# Patient Record
Sex: Female | Born: 1950 | Hispanic: Refuse to answer | Smoking: Never smoker
Health system: Southern US, Community
[De-identification: ages and names within clinical notes are randomized; demographics above are authoritative.]

## PROBLEM LIST (undated history)

## (undated) DIAGNOSIS — I10 Essential (primary) hypertension: Secondary | ICD-10-CM

## (undated) DIAGNOSIS — J449 Chronic obstructive pulmonary disease, unspecified: Secondary | ICD-10-CM

## (undated) DIAGNOSIS — J45909 Unspecified asthma, uncomplicated: Secondary | ICD-10-CM

## (undated) DIAGNOSIS — M109 Gout, unspecified: Secondary | ICD-10-CM

## (undated) DIAGNOSIS — I509 Heart failure, unspecified: Secondary | ICD-10-CM

## (undated) HISTORY — PX: COLON SURGERY: SHX602

## (undated) HISTORY — PX: LIPOMA EXCISION: SHX5283

## (undated) HISTORY — PX: PARTIAL HYSTERECTOMY: SHX80

## (undated) HISTORY — PX: CARPAL TUNNEL RELEASE: SHX101

## (undated) HISTORY — PX: ABDOMINAL HYSTERECTOMY: SHX81

---

## 2009-01-10 ENCOUNTER — Ambulatory Visit (HOSPITAL_COMMUNITY): Admission: RE | Admit: 2009-01-10 | Discharge: 2009-01-10 | Payer: Self-pay | Admitting: Ophthalmology

## 2009-01-30 ENCOUNTER — Ambulatory Visit (HOSPITAL_COMMUNITY): Admission: RE | Admit: 2009-01-30 | Discharge: 2009-01-30 | Payer: Self-pay | Admitting: Ophthalmology

## 2009-11-29 ENCOUNTER — Ambulatory Visit: Payer: Self-pay | Admitting: Cardiology

## 2010-10-19 LAB — POCT I-STAT 4, (NA,K, GLUC, HGB,HCT)
Glucose, Bld: 108 mg/dL — ABNORMAL HIGH (ref 70–99)
HCT: 41 % (ref 36.0–46.0)
Hemoglobin: 13.3 g/dL (ref 12.0–15.0)
Potassium: 4.3 mEq/L (ref 3.5–5.1)

## 2010-10-20 LAB — HEMOGLOBIN AND HEMATOCRIT, BLOOD
HCT: 35.2 % — ABNORMAL LOW (ref 36.0–46.0)
Hemoglobin: 12.2 g/dL (ref 12.0–15.0)

## 2010-10-20 LAB — BASIC METABOLIC PANEL
Chloride: 103 mEq/L (ref 96–112)
GFR calc Af Amer: 41 mL/min — ABNORMAL LOW (ref >60)
Potassium: 3.1 mEq/L — ABNORMAL LOW (ref 3.5–5.1)
Sodium: 139 mEq/L (ref 135–145)

## 2011-08-13 DIAGNOSIS — J0100 Acute maxillary sinusitis, unspecified: Secondary | ICD-10-CM | POA: Diagnosis not present

## 2011-08-13 DIAGNOSIS — N3941 Urge incontinence: Secondary | ICD-10-CM | POA: Diagnosis not present

## 2011-08-13 DIAGNOSIS — J45909 Unspecified asthma, uncomplicated: Secondary | ICD-10-CM | POA: Diagnosis not present

## 2011-08-13 DIAGNOSIS — M109 Gout, unspecified: Secondary | ICD-10-CM | POA: Diagnosis not present

## 2011-08-13 DIAGNOSIS — J209 Acute bronchitis, unspecified: Secondary | ICD-10-CM | POA: Diagnosis not present

## 2011-08-13 DIAGNOSIS — I10 Essential (primary) hypertension: Secondary | ICD-10-CM | POA: Diagnosis not present

## 2011-08-13 DIAGNOSIS — M549 Dorsalgia, unspecified: Secondary | ICD-10-CM | POA: Diagnosis not present

## 2011-11-20 DIAGNOSIS — J209 Acute bronchitis, unspecified: Secondary | ICD-10-CM | POA: Diagnosis not present

## 2011-11-20 DIAGNOSIS — M76899 Other specified enthesopathies of unspecified lower limb, excluding foot: Secondary | ICD-10-CM | POA: Diagnosis not present

## 2011-11-26 DIAGNOSIS — M1710 Unilateral primary osteoarthritis, unspecified knee: Secondary | ICD-10-CM | POA: Diagnosis not present

## 2011-12-22 DIAGNOSIS — J45909 Unspecified asthma, uncomplicated: Secondary | ICD-10-CM | POA: Diagnosis not present

## 2011-12-22 DIAGNOSIS — I10 Essential (primary) hypertension: Secondary | ICD-10-CM | POA: Diagnosis not present

## 2011-12-29 DIAGNOSIS — I10 Essential (primary) hypertension: Secondary | ICD-10-CM | POA: Diagnosis not present

## 2012-03-31 DIAGNOSIS — J209 Acute bronchitis, unspecified: Secondary | ICD-10-CM | POA: Diagnosis not present

## 2012-06-21 DIAGNOSIS — J449 Chronic obstructive pulmonary disease, unspecified: Secondary | ICD-10-CM | POA: Diagnosis not present

## 2012-06-28 DIAGNOSIS — R4182 Altered mental status, unspecified: Secondary | ICD-10-CM | POA: Diagnosis not present

## 2012-06-28 DIAGNOSIS — I499 Cardiac arrhythmia, unspecified: Secondary | ICD-10-CM | POA: Diagnosis not present

## 2012-06-28 DIAGNOSIS — I679 Cerebrovascular disease, unspecified: Secondary | ICD-10-CM | POA: Diagnosis not present

## 2012-06-28 DIAGNOSIS — R443 Hallucinations, unspecified: Secondary | ICD-10-CM | POA: Diagnosis not present

## 2012-06-28 DIAGNOSIS — R918 Other nonspecific abnormal finding of lung field: Secondary | ICD-10-CM | POA: Diagnosis not present

## 2012-06-28 DIAGNOSIS — R000 Tachycardia, unspecified: Secondary | ICD-10-CM | POA: Diagnosis not present

## 2012-07-08 DIAGNOSIS — Z23 Encounter for immunization: Secondary | ICD-10-CM | POA: Diagnosis not present

## 2012-07-09 DIAGNOSIS — J45901 Unspecified asthma with (acute) exacerbation: Secondary | ICD-10-CM | POA: Diagnosis not present

## 2012-07-09 DIAGNOSIS — I4891 Unspecified atrial fibrillation: Secondary | ICD-10-CM | POA: Diagnosis not present

## 2012-07-09 DIAGNOSIS — M159 Polyosteoarthritis, unspecified: Secondary | ICD-10-CM | POA: Diagnosis not present

## 2012-07-09 DIAGNOSIS — M109 Gout, unspecified: Secondary | ICD-10-CM | POA: Diagnosis not present

## 2012-07-09 DIAGNOSIS — I10 Essential (primary) hypertension: Secondary | ICD-10-CM | POA: Diagnosis not present

## 2012-07-11 DIAGNOSIS — I10 Essential (primary) hypertension: Secondary | ICD-10-CM | POA: Diagnosis not present

## 2012-07-11 DIAGNOSIS — I4891 Unspecified atrial fibrillation: Secondary | ICD-10-CM | POA: Diagnosis not present

## 2012-07-11 DIAGNOSIS — J45901 Unspecified asthma with (acute) exacerbation: Secondary | ICD-10-CM | POA: Diagnosis not present

## 2012-07-11 DIAGNOSIS — M109 Gout, unspecified: Secondary | ICD-10-CM | POA: Diagnosis not present

## 2012-07-11 DIAGNOSIS — M159 Polyosteoarthritis, unspecified: Secondary | ICD-10-CM | POA: Diagnosis not present

## 2012-07-12 DIAGNOSIS — I10 Essential (primary) hypertension: Secondary | ICD-10-CM | POA: Diagnosis not present

## 2012-07-12 DIAGNOSIS — I4891 Unspecified atrial fibrillation: Secondary | ICD-10-CM | POA: Diagnosis not present

## 2012-07-12 DIAGNOSIS — J45901 Unspecified asthma with (acute) exacerbation: Secondary | ICD-10-CM | POA: Diagnosis not present

## 2012-07-12 DIAGNOSIS — M109 Gout, unspecified: Secondary | ICD-10-CM | POA: Diagnosis not present

## 2012-07-12 DIAGNOSIS — M159 Polyosteoarthritis, unspecified: Secondary | ICD-10-CM | POA: Diagnosis not present

## 2012-07-14 DIAGNOSIS — M159 Polyosteoarthritis, unspecified: Secondary | ICD-10-CM | POA: Diagnosis not present

## 2012-07-14 DIAGNOSIS — M109 Gout, unspecified: Secondary | ICD-10-CM | POA: Diagnosis not present

## 2012-07-14 DIAGNOSIS — I4891 Unspecified atrial fibrillation: Secondary | ICD-10-CM | POA: Diagnosis not present

## 2012-07-14 DIAGNOSIS — J45901 Unspecified asthma with (acute) exacerbation: Secondary | ICD-10-CM | POA: Diagnosis not present

## 2012-07-14 DIAGNOSIS — I10 Essential (primary) hypertension: Secondary | ICD-10-CM | POA: Diagnosis not present

## 2012-07-15 DIAGNOSIS — J45901 Unspecified asthma with (acute) exacerbation: Secondary | ICD-10-CM | POA: Diagnosis not present

## 2012-07-15 DIAGNOSIS — M109 Gout, unspecified: Secondary | ICD-10-CM | POA: Diagnosis not present

## 2012-07-15 DIAGNOSIS — I10 Essential (primary) hypertension: Secondary | ICD-10-CM | POA: Diagnosis not present

## 2012-07-15 DIAGNOSIS — I4891 Unspecified atrial fibrillation: Secondary | ICD-10-CM | POA: Diagnosis not present

## 2012-07-15 DIAGNOSIS — M159 Polyosteoarthritis, unspecified: Secondary | ICD-10-CM | POA: Diagnosis not present

## 2012-07-18 DIAGNOSIS — F05 Delirium due to known physiological condition: Secondary | ICD-10-CM | POA: Diagnosis not present

## 2012-07-18 DIAGNOSIS — M159 Polyosteoarthritis, unspecified: Secondary | ICD-10-CM | POA: Diagnosis not present

## 2012-07-18 DIAGNOSIS — I10 Essential (primary) hypertension: Secondary | ICD-10-CM | POA: Diagnosis not present

## 2012-07-18 DIAGNOSIS — M109 Gout, unspecified: Secondary | ICD-10-CM | POA: Diagnosis not present

## 2012-07-18 DIAGNOSIS — J45901 Unspecified asthma with (acute) exacerbation: Secondary | ICD-10-CM | POA: Diagnosis not present

## 2012-07-18 DIAGNOSIS — I4891 Unspecified atrial fibrillation: Secondary | ICD-10-CM | POA: Diagnosis not present

## 2012-07-19 DIAGNOSIS — I10 Essential (primary) hypertension: Secondary | ICD-10-CM | POA: Diagnosis not present

## 2012-07-19 DIAGNOSIS — I4891 Unspecified atrial fibrillation: Secondary | ICD-10-CM | POA: Diagnosis not present

## 2012-07-19 DIAGNOSIS — J45901 Unspecified asthma with (acute) exacerbation: Secondary | ICD-10-CM | POA: Diagnosis not present

## 2012-07-19 DIAGNOSIS — M159 Polyosteoarthritis, unspecified: Secondary | ICD-10-CM | POA: Diagnosis not present

## 2012-07-19 DIAGNOSIS — M109 Gout, unspecified: Secondary | ICD-10-CM | POA: Diagnosis not present

## 2012-07-20 DIAGNOSIS — M159 Polyosteoarthritis, unspecified: Secondary | ICD-10-CM | POA: Diagnosis not present

## 2012-07-20 DIAGNOSIS — M109 Gout, unspecified: Secondary | ICD-10-CM | POA: Diagnosis not present

## 2012-07-20 DIAGNOSIS — J45901 Unspecified asthma with (acute) exacerbation: Secondary | ICD-10-CM | POA: Diagnosis not present

## 2012-07-20 DIAGNOSIS — I4891 Unspecified atrial fibrillation: Secondary | ICD-10-CM | POA: Diagnosis not present

## 2012-07-20 DIAGNOSIS — I10 Essential (primary) hypertension: Secondary | ICD-10-CM | POA: Diagnosis not present

## 2012-07-21 DIAGNOSIS — J45901 Unspecified asthma with (acute) exacerbation: Secondary | ICD-10-CM | POA: Diagnosis not present

## 2012-07-21 DIAGNOSIS — I10 Essential (primary) hypertension: Secondary | ICD-10-CM | POA: Diagnosis not present

## 2012-07-21 DIAGNOSIS — M159 Polyosteoarthritis, unspecified: Secondary | ICD-10-CM | POA: Diagnosis not present

## 2012-07-21 DIAGNOSIS — M109 Gout, unspecified: Secondary | ICD-10-CM | POA: Diagnosis not present

## 2012-07-21 DIAGNOSIS — I4891 Unspecified atrial fibrillation: Secondary | ICD-10-CM | POA: Diagnosis not present

## 2012-07-25 DIAGNOSIS — M159 Polyosteoarthritis, unspecified: Secondary | ICD-10-CM | POA: Diagnosis not present

## 2012-07-25 DIAGNOSIS — I4891 Unspecified atrial fibrillation: Secondary | ICD-10-CM | POA: Diagnosis not present

## 2012-07-25 DIAGNOSIS — J45901 Unspecified asthma with (acute) exacerbation: Secondary | ICD-10-CM | POA: Diagnosis not present

## 2012-07-25 DIAGNOSIS — I10 Essential (primary) hypertension: Secondary | ICD-10-CM | POA: Diagnosis not present

## 2012-07-25 DIAGNOSIS — M109 Gout, unspecified: Secondary | ICD-10-CM | POA: Diagnosis not present

## 2012-07-27 DIAGNOSIS — I10 Essential (primary) hypertension: Secondary | ICD-10-CM | POA: Diagnosis not present

## 2012-07-27 DIAGNOSIS — M159 Polyosteoarthritis, unspecified: Secondary | ICD-10-CM | POA: Diagnosis not present

## 2012-07-27 DIAGNOSIS — M109 Gout, unspecified: Secondary | ICD-10-CM | POA: Diagnosis not present

## 2012-07-27 DIAGNOSIS — I4891 Unspecified atrial fibrillation: Secondary | ICD-10-CM | POA: Diagnosis not present

## 2012-07-27 DIAGNOSIS — J45901 Unspecified asthma with (acute) exacerbation: Secondary | ICD-10-CM | POA: Diagnosis not present

## 2012-08-02 DIAGNOSIS — M159 Polyosteoarthritis, unspecified: Secondary | ICD-10-CM | POA: Diagnosis not present

## 2012-08-02 DIAGNOSIS — M109 Gout, unspecified: Secondary | ICD-10-CM | POA: Diagnosis not present

## 2012-08-02 DIAGNOSIS — I10 Essential (primary) hypertension: Secondary | ICD-10-CM | POA: Diagnosis not present

## 2012-08-02 DIAGNOSIS — I4891 Unspecified atrial fibrillation: Secondary | ICD-10-CM | POA: Diagnosis not present

## 2012-08-02 DIAGNOSIS — J45901 Unspecified asthma with (acute) exacerbation: Secondary | ICD-10-CM | POA: Diagnosis not present

## 2012-08-03 DIAGNOSIS — I10 Essential (primary) hypertension: Secondary | ICD-10-CM | POA: Diagnosis not present

## 2012-08-03 DIAGNOSIS — I4891 Unspecified atrial fibrillation: Secondary | ICD-10-CM | POA: Diagnosis not present

## 2012-08-03 DIAGNOSIS — M109 Gout, unspecified: Secondary | ICD-10-CM | POA: Diagnosis not present

## 2012-08-03 DIAGNOSIS — J45901 Unspecified asthma with (acute) exacerbation: Secondary | ICD-10-CM | POA: Diagnosis not present

## 2012-08-03 DIAGNOSIS — M159 Polyosteoarthritis, unspecified: Secondary | ICD-10-CM | POA: Diagnosis not present

## 2012-08-04 DIAGNOSIS — I10 Essential (primary) hypertension: Secondary | ICD-10-CM | POA: Diagnosis not present

## 2012-08-04 DIAGNOSIS — M159 Polyosteoarthritis, unspecified: Secondary | ICD-10-CM | POA: Diagnosis not present

## 2012-08-04 DIAGNOSIS — I4891 Unspecified atrial fibrillation: Secondary | ICD-10-CM | POA: Diagnosis not present

## 2012-08-04 DIAGNOSIS — M109 Gout, unspecified: Secondary | ICD-10-CM | POA: Diagnosis not present

## 2012-08-04 DIAGNOSIS — J45901 Unspecified asthma with (acute) exacerbation: Secondary | ICD-10-CM | POA: Diagnosis not present

## 2012-08-05 DIAGNOSIS — J45901 Unspecified asthma with (acute) exacerbation: Secondary | ICD-10-CM | POA: Diagnosis not present

## 2012-08-05 DIAGNOSIS — M109 Gout, unspecified: Secondary | ICD-10-CM | POA: Diagnosis not present

## 2012-08-05 DIAGNOSIS — I10 Essential (primary) hypertension: Secondary | ICD-10-CM | POA: Diagnosis not present

## 2012-08-05 DIAGNOSIS — M159 Polyosteoarthritis, unspecified: Secondary | ICD-10-CM | POA: Diagnosis not present

## 2012-08-05 DIAGNOSIS — I4891 Unspecified atrial fibrillation: Secondary | ICD-10-CM | POA: Diagnosis not present

## 2012-08-09 DIAGNOSIS — M109 Gout, unspecified: Secondary | ICD-10-CM | POA: Diagnosis not present

## 2012-08-09 DIAGNOSIS — M159 Polyosteoarthritis, unspecified: Secondary | ICD-10-CM | POA: Diagnosis not present

## 2012-08-09 DIAGNOSIS — J45901 Unspecified asthma with (acute) exacerbation: Secondary | ICD-10-CM | POA: Diagnosis not present

## 2012-08-09 DIAGNOSIS — I10 Essential (primary) hypertension: Secondary | ICD-10-CM | POA: Diagnosis not present

## 2012-08-09 DIAGNOSIS — I4891 Unspecified atrial fibrillation: Secondary | ICD-10-CM | POA: Diagnosis not present

## 2012-08-11 DIAGNOSIS — J45901 Unspecified asthma with (acute) exacerbation: Secondary | ICD-10-CM | POA: Diagnosis not present

## 2012-08-11 DIAGNOSIS — I4891 Unspecified atrial fibrillation: Secondary | ICD-10-CM | POA: Diagnosis not present

## 2012-08-11 DIAGNOSIS — M159 Polyosteoarthritis, unspecified: Secondary | ICD-10-CM | POA: Diagnosis not present

## 2012-08-11 DIAGNOSIS — M109 Gout, unspecified: Secondary | ICD-10-CM | POA: Diagnosis not present

## 2012-08-11 DIAGNOSIS — I10 Essential (primary) hypertension: Secondary | ICD-10-CM | POA: Diagnosis not present

## 2012-08-17 DIAGNOSIS — M109 Gout, unspecified: Secondary | ICD-10-CM | POA: Diagnosis not present

## 2012-08-17 DIAGNOSIS — I10 Essential (primary) hypertension: Secondary | ICD-10-CM | POA: Diagnosis not present

## 2012-08-17 DIAGNOSIS — I4891 Unspecified atrial fibrillation: Secondary | ICD-10-CM | POA: Diagnosis not present

## 2012-08-17 DIAGNOSIS — M159 Polyosteoarthritis, unspecified: Secondary | ICD-10-CM | POA: Diagnosis not present

## 2012-08-17 DIAGNOSIS — J45901 Unspecified asthma with (acute) exacerbation: Secondary | ICD-10-CM | POA: Diagnosis not present

## 2012-08-23 DIAGNOSIS — I10 Essential (primary) hypertension: Secondary | ICD-10-CM | POA: Diagnosis not present

## 2012-08-23 DIAGNOSIS — J45901 Unspecified asthma with (acute) exacerbation: Secondary | ICD-10-CM | POA: Diagnosis not present

## 2012-08-23 DIAGNOSIS — M159 Polyosteoarthritis, unspecified: Secondary | ICD-10-CM | POA: Diagnosis not present

## 2012-08-23 DIAGNOSIS — M109 Gout, unspecified: Secondary | ICD-10-CM | POA: Diagnosis not present

## 2012-08-23 DIAGNOSIS — I4891 Unspecified atrial fibrillation: Secondary | ICD-10-CM | POA: Diagnosis not present

## 2012-08-27 DIAGNOSIS — Z79899 Other long term (current) drug therapy: Secondary | ICD-10-CM | POA: Diagnosis not present

## 2012-08-27 DIAGNOSIS — I10 Essential (primary) hypertension: Secondary | ICD-10-CM | POA: Diagnosis not present

## 2012-08-27 DIAGNOSIS — J0100 Acute maxillary sinusitis, unspecified: Secondary | ICD-10-CM | POA: Diagnosis not present

## 2012-08-27 DIAGNOSIS — IMO0002 Reserved for concepts with insufficient information to code with codable children: Secondary | ICD-10-CM | POA: Diagnosis not present

## 2012-08-27 DIAGNOSIS — J0110 Acute frontal sinusitis, unspecified: Secondary | ICD-10-CM | POA: Diagnosis not present

## 2012-08-27 DIAGNOSIS — J329 Chronic sinusitis, unspecified: Secondary | ICD-10-CM | POA: Diagnosis not present

## 2012-09-02 DIAGNOSIS — R0789 Other chest pain: Secondary | ICD-10-CM | POA: Diagnosis not present

## 2012-09-02 DIAGNOSIS — R52 Pain, unspecified: Secondary | ICD-10-CM | POA: Diagnosis not present

## 2012-09-06 DIAGNOSIS — I10 Essential (primary) hypertension: Secondary | ICD-10-CM | POA: Diagnosis not present

## 2012-10-19 DIAGNOSIS — H524 Presbyopia: Secondary | ICD-10-CM | POA: Diagnosis not present

## 2012-10-19 DIAGNOSIS — Z961 Presence of intraocular lens: Secondary | ICD-10-CM | POA: Diagnosis not present

## 2012-10-19 DIAGNOSIS — H35379 Puckering of macula, unspecified eye: Secondary | ICD-10-CM | POA: Diagnosis not present

## 2012-11-06 DIAGNOSIS — J45901 Unspecified asthma with (acute) exacerbation: Secondary | ICD-10-CM | POA: Diagnosis not present

## 2012-11-06 DIAGNOSIS — I4891 Unspecified atrial fibrillation: Secondary | ICD-10-CM | POA: Diagnosis not present

## 2012-11-06 DIAGNOSIS — R32 Unspecified urinary incontinence: Secondary | ICD-10-CM | POA: Diagnosis not present

## 2012-11-06 DIAGNOSIS — M159 Polyosteoarthritis, unspecified: Secondary | ICD-10-CM | POA: Diagnosis not present

## 2012-11-29 DIAGNOSIS — I10 Essential (primary) hypertension: Secondary | ICD-10-CM | POA: Diagnosis not present

## 2012-11-29 DIAGNOSIS — Z0000 Encounter for general adult medical examination without abnormal findings: Secondary | ICD-10-CM | POA: Diagnosis not present

## 2012-12-27 DIAGNOSIS — J45909 Unspecified asthma, uncomplicated: Secondary | ICD-10-CM | POA: Diagnosis not present

## 2012-12-27 DIAGNOSIS — Z01419 Encounter for gynecological examination (general) (routine) without abnormal findings: Secondary | ICD-10-CM | POA: Diagnosis not present

## 2012-12-27 DIAGNOSIS — I10 Essential (primary) hypertension: Secondary | ICD-10-CM | POA: Diagnosis not present

## 2012-12-29 DIAGNOSIS — M25569 Pain in unspecified knee: Secondary | ICD-10-CM | POA: Diagnosis not present

## 2012-12-29 DIAGNOSIS — M1710 Unilateral primary osteoarthritis, unspecified knee: Secondary | ICD-10-CM | POA: Diagnosis not present

## 2013-01-05 DIAGNOSIS — R32 Unspecified urinary incontinence: Secondary | ICD-10-CM | POA: Diagnosis not present

## 2013-01-05 DIAGNOSIS — J45901 Unspecified asthma with (acute) exacerbation: Secondary | ICD-10-CM | POA: Diagnosis not present

## 2013-01-05 DIAGNOSIS — I4891 Unspecified atrial fibrillation: Secondary | ICD-10-CM | POA: Diagnosis not present

## 2013-01-05 DIAGNOSIS — M159 Polyosteoarthritis, unspecified: Secondary | ICD-10-CM | POA: Diagnosis not present

## 2013-01-26 DIAGNOSIS — R161 Splenomegaly, not elsewhere classified: Secondary | ICD-10-CM | POA: Diagnosis not present

## 2013-02-02 DIAGNOSIS — R161 Splenomegaly, not elsewhere classified: Secondary | ICD-10-CM | POA: Diagnosis not present

## 2013-02-02 DIAGNOSIS — N281 Cyst of kidney, acquired: Secondary | ICD-10-CM | POA: Diagnosis not present

## 2013-02-02 DIAGNOSIS — Z1211 Encounter for screening for malignant neoplasm of colon: Secondary | ICD-10-CM | POA: Diagnosis not present

## 2013-02-06 DIAGNOSIS — M899 Disorder of bone, unspecified: Secondary | ICD-10-CM | POA: Diagnosis not present

## 2013-02-06 DIAGNOSIS — Z90710 Acquired absence of both cervix and uterus: Secondary | ICD-10-CM | POA: Diagnosis not present

## 2013-02-06 DIAGNOSIS — Z780 Asymptomatic menopausal state: Secondary | ICD-10-CM | POA: Diagnosis not present

## 2013-02-16 DIAGNOSIS — Z961 Presence of intraocular lens: Secondary | ICD-10-CM | POA: Diagnosis not present

## 2013-02-16 DIAGNOSIS — H353190 Nonexudative age-related macular degeneration, unspecified eye, stage unspecified: Secondary | ICD-10-CM | POA: Diagnosis not present

## 2013-02-21 DIAGNOSIS — I10 Essential (primary) hypertension: Secondary | ICD-10-CM | POA: Diagnosis not present

## 2013-02-21 DIAGNOSIS — Z8249 Family history of ischemic heart disease and other diseases of the circulatory system: Secondary | ICD-10-CM | POA: Diagnosis not present

## 2013-02-21 DIAGNOSIS — J45909 Unspecified asthma, uncomplicated: Secondary | ICD-10-CM | POA: Diagnosis not present

## 2013-02-21 DIAGNOSIS — E7800 Pure hypercholesterolemia, unspecified: Secondary | ICD-10-CM | POA: Diagnosis not present

## 2013-02-21 DIAGNOSIS — Z1211 Encounter for screening for malignant neoplasm of colon: Secondary | ICD-10-CM | POA: Diagnosis not present

## 2013-02-21 DIAGNOSIS — Z79899 Other long term (current) drug therapy: Secondary | ICD-10-CM | POA: Diagnosis not present

## 2013-03-06 DIAGNOSIS — R32 Unspecified urinary incontinence: Secondary | ICD-10-CM | POA: Diagnosis not present

## 2013-03-06 DIAGNOSIS — M159 Polyosteoarthritis, unspecified: Secondary | ICD-10-CM | POA: Diagnosis not present

## 2013-03-06 DIAGNOSIS — I4891 Unspecified atrial fibrillation: Secondary | ICD-10-CM | POA: Diagnosis not present

## 2013-03-06 DIAGNOSIS — J45901 Unspecified asthma with (acute) exacerbation: Secondary | ICD-10-CM | POA: Diagnosis not present

## 2013-03-21 DIAGNOSIS — J309 Allergic rhinitis, unspecified: Secondary | ICD-10-CM | POA: Diagnosis not present

## 2013-05-05 DIAGNOSIS — J45901 Unspecified asthma with (acute) exacerbation: Secondary | ICD-10-CM | POA: Diagnosis not present

## 2013-05-05 DIAGNOSIS — I4891 Unspecified atrial fibrillation: Secondary | ICD-10-CM | POA: Diagnosis not present

## 2013-05-05 DIAGNOSIS — M159 Polyosteoarthritis, unspecified: Secondary | ICD-10-CM | POA: Diagnosis not present

## 2013-05-05 DIAGNOSIS — R32 Unspecified urinary incontinence: Secondary | ICD-10-CM | POA: Diagnosis not present

## 2013-06-12 DIAGNOSIS — I10 Essential (primary) hypertension: Secondary | ICD-10-CM | POA: Diagnosis not present

## 2013-06-12 DIAGNOSIS — Z131 Encounter for screening for diabetes mellitus: Secondary | ICD-10-CM | POA: Diagnosis not present

## 2013-07-04 DIAGNOSIS — J45901 Unspecified asthma with (acute) exacerbation: Secondary | ICD-10-CM | POA: Diagnosis not present

## 2013-07-04 DIAGNOSIS — I4891 Unspecified atrial fibrillation: Secondary | ICD-10-CM | POA: Diagnosis not present

## 2013-07-04 DIAGNOSIS — M159 Polyosteoarthritis, unspecified: Secondary | ICD-10-CM | POA: Diagnosis not present

## 2013-07-04 DIAGNOSIS — R32 Unspecified urinary incontinence: Secondary | ICD-10-CM | POA: Diagnosis not present

## 2013-07-27 DIAGNOSIS — Z1211 Encounter for screening for malignant neoplasm of colon: Secondary | ICD-10-CM | POA: Diagnosis not present

## 2013-07-27 DIAGNOSIS — I10 Essential (primary) hypertension: Secondary | ICD-10-CM | POA: Diagnosis not present

## 2013-08-21 DIAGNOSIS — Z961 Presence of intraocular lens: Secondary | ICD-10-CM | POA: Diagnosis not present

## 2013-08-21 DIAGNOSIS — H35379 Puckering of macula, unspecified eye: Secondary | ICD-10-CM | POA: Diagnosis not present

## 2013-08-25 DIAGNOSIS — Z833 Family history of diabetes mellitus: Secondary | ICD-10-CM | POA: Diagnosis not present

## 2013-08-25 DIAGNOSIS — Z825 Family history of asthma and other chronic lower respiratory diseases: Secondary | ICD-10-CM | POA: Diagnosis not present

## 2013-08-25 DIAGNOSIS — Z79899 Other long term (current) drug therapy: Secondary | ICD-10-CM | POA: Diagnosis not present

## 2013-08-25 DIAGNOSIS — K449 Diaphragmatic hernia without obstruction or gangrene: Secondary | ICD-10-CM | POA: Diagnosis not present

## 2013-08-25 DIAGNOSIS — J441 Chronic obstructive pulmonary disease with (acute) exacerbation: Secondary | ICD-10-CM | POA: Diagnosis not present

## 2013-08-25 DIAGNOSIS — J45901 Unspecified asthma with (acute) exacerbation: Secondary | ICD-10-CM | POA: Diagnosis not present

## 2013-08-25 DIAGNOSIS — I10 Essential (primary) hypertension: Secondary | ICD-10-CM | POA: Diagnosis not present

## 2013-08-25 DIAGNOSIS — E785 Hyperlipidemia, unspecified: Secondary | ICD-10-CM | POA: Diagnosis not present

## 2013-08-25 DIAGNOSIS — Z780 Asymptomatic menopausal state: Secondary | ICD-10-CM | POA: Diagnosis not present

## 2013-08-25 DIAGNOSIS — J9819 Other pulmonary collapse: Secondary | ICD-10-CM | POA: Diagnosis not present

## 2013-08-25 DIAGNOSIS — Z8249 Family history of ischemic heart disease and other diseases of the circulatory system: Secondary | ICD-10-CM | POA: Diagnosis not present

## 2013-08-25 DIAGNOSIS — Z880 Allergy status to penicillin: Secondary | ICD-10-CM | POA: Diagnosis not present

## 2013-08-25 DIAGNOSIS — R0602 Shortness of breath: Secondary | ICD-10-CM | POA: Diagnosis not present

## 2013-08-25 DIAGNOSIS — IMO0002 Reserved for concepts with insufficient information to code with codable children: Secondary | ICD-10-CM | POA: Diagnosis not present

## 2013-08-25 DIAGNOSIS — M109 Gout, unspecified: Secondary | ICD-10-CM | POA: Diagnosis not present

## 2013-08-26 DIAGNOSIS — K449 Diaphragmatic hernia without obstruction or gangrene: Secondary | ICD-10-CM | POA: Diagnosis not present

## 2013-08-26 DIAGNOSIS — I10 Essential (primary) hypertension: Secondary | ICD-10-CM | POA: Diagnosis not present

## 2013-08-26 DIAGNOSIS — J45901 Unspecified asthma with (acute) exacerbation: Secondary | ICD-10-CM | POA: Diagnosis not present

## 2013-08-26 DIAGNOSIS — M109 Gout, unspecified: Secondary | ICD-10-CM | POA: Diagnosis not present

## 2013-08-26 DIAGNOSIS — R0602 Shortness of breath: Secondary | ICD-10-CM | POA: Diagnosis not present

## 2013-08-27 DIAGNOSIS — I10 Essential (primary) hypertension: Secondary | ICD-10-CM | POA: Diagnosis not present

## 2013-08-27 DIAGNOSIS — R0602 Shortness of breath: Secondary | ICD-10-CM | POA: Diagnosis not present

## 2013-08-27 DIAGNOSIS — K449 Diaphragmatic hernia without obstruction or gangrene: Secondary | ICD-10-CM | POA: Diagnosis not present

## 2013-08-27 DIAGNOSIS — M109 Gout, unspecified: Secondary | ICD-10-CM | POA: Diagnosis not present

## 2013-08-27 DIAGNOSIS — J45901 Unspecified asthma with (acute) exacerbation: Secondary | ICD-10-CM | POA: Diagnosis not present

## 2013-09-02 DIAGNOSIS — R32 Unspecified urinary incontinence: Secondary | ICD-10-CM | POA: Diagnosis not present

## 2013-09-02 DIAGNOSIS — M159 Polyosteoarthritis, unspecified: Secondary | ICD-10-CM | POA: Diagnosis not present

## 2013-09-02 DIAGNOSIS — J45901 Unspecified asthma with (acute) exacerbation: Secondary | ICD-10-CM | POA: Diagnosis not present

## 2013-09-02 DIAGNOSIS — I4891 Unspecified atrial fibrillation: Secondary | ICD-10-CM | POA: Diagnosis not present

## 2013-09-18 DIAGNOSIS — J309 Allergic rhinitis, unspecified: Secondary | ICD-10-CM | POA: Diagnosis not present

## 2013-09-18 DIAGNOSIS — S838X9A Sprain of other specified parts of unspecified knee, initial encounter: Secondary | ICD-10-CM | POA: Diagnosis not present

## 2013-11-01 DIAGNOSIS — J45901 Unspecified asthma with (acute) exacerbation: Secondary | ICD-10-CM | POA: Diagnosis not present

## 2013-11-01 DIAGNOSIS — R32 Unspecified urinary incontinence: Secondary | ICD-10-CM | POA: Diagnosis not present

## 2013-11-01 DIAGNOSIS — M159 Polyosteoarthritis, unspecified: Secondary | ICD-10-CM | POA: Diagnosis not present

## 2013-11-01 DIAGNOSIS — I4891 Unspecified atrial fibrillation: Secondary | ICD-10-CM | POA: Diagnosis not present

## 2013-11-02 DIAGNOSIS — I10 Essential (primary) hypertension: Secondary | ICD-10-CM | POA: Diagnosis not present

## 2013-11-02 DIAGNOSIS — J45909 Unspecified asthma, uncomplicated: Secondary | ICD-10-CM | POA: Diagnosis not present

## 2013-11-11 DIAGNOSIS — I10 Essential (primary) hypertension: Secondary | ICD-10-CM | POA: Diagnosis not present

## 2013-11-11 DIAGNOSIS — Z79899 Other long term (current) drug therapy: Secondary | ICD-10-CM | POA: Diagnosis not present

## 2013-11-11 DIAGNOSIS — L03119 Cellulitis of unspecified part of limb: Secondary | ICD-10-CM | POA: Diagnosis not present

## 2013-11-11 DIAGNOSIS — J45909 Unspecified asthma, uncomplicated: Secondary | ICD-10-CM | POA: Diagnosis not present

## 2013-11-11 DIAGNOSIS — L02619 Cutaneous abscess of unspecified foot: Secondary | ICD-10-CM | POA: Diagnosis not present

## 2013-11-11 DIAGNOSIS — IMO0002 Reserved for concepts with insufficient information to code with codable children: Secondary | ICD-10-CM | POA: Diagnosis not present

## 2013-11-13 DIAGNOSIS — L02419 Cutaneous abscess of limb, unspecified: Secondary | ICD-10-CM | POA: Diagnosis not present

## 2013-11-24 DIAGNOSIS — S81009A Unspecified open wound, unspecified knee, initial encounter: Secondary | ICD-10-CM | POA: Diagnosis not present

## 2013-11-24 DIAGNOSIS — J45909 Unspecified asthma, uncomplicated: Secondary | ICD-10-CM | POA: Diagnosis not present

## 2013-11-24 DIAGNOSIS — Z8249 Family history of ischemic heart disease and other diseases of the circulatory system: Secondary | ICD-10-CM | POA: Diagnosis not present

## 2013-11-24 DIAGNOSIS — I10 Essential (primary) hypertension: Secondary | ICD-10-CM | POA: Diagnosis not present

## 2013-11-24 DIAGNOSIS — S90569A Insect bite (nonvenomous), unspecified ankle, initial encounter: Secondary | ICD-10-CM | POA: Diagnosis not present

## 2013-11-24 DIAGNOSIS — L97509 Non-pressure chronic ulcer of other part of unspecified foot with unspecified severity: Secondary | ICD-10-CM | POA: Diagnosis not present

## 2013-11-24 DIAGNOSIS — L089 Local infection of the skin and subcutaneous tissue, unspecified: Secondary | ICD-10-CM | POA: Diagnosis not present

## 2013-11-24 DIAGNOSIS — K219 Gastro-esophageal reflux disease without esophagitis: Secondary | ICD-10-CM | POA: Diagnosis not present

## 2013-11-24 DIAGNOSIS — Z79899 Other long term (current) drug therapy: Secondary | ICD-10-CM | POA: Diagnosis not present

## 2013-11-24 DIAGNOSIS — T148 Other injury of unspecified body region: Secondary | ICD-10-CM | POA: Diagnosis not present

## 2013-11-24 DIAGNOSIS — I509 Heart failure, unspecified: Secondary | ICD-10-CM | POA: Diagnosis not present

## 2013-11-24 DIAGNOSIS — E7800 Pure hypercholesterolemia, unspecified: Secondary | ICD-10-CM | POA: Diagnosis not present

## 2013-11-24 DIAGNOSIS — IMO0002 Reserved for concepts with insufficient information to code with codable children: Secondary | ICD-10-CM | POA: Diagnosis not present

## 2013-11-24 DIAGNOSIS — W57XXXA Bitten or stung by nonvenomous insect and other nonvenomous arthropods, initial encounter: Secondary | ICD-10-CM | POA: Diagnosis not present

## 2013-11-25 ENCOUNTER — Emergency Department (HOSPITAL_COMMUNITY)
Admission: EM | Admit: 2013-11-25 | Discharge: 2013-11-25 | Disposition: A | Discharge status: Home / Self Care | Payer: Medicare Other | Attending: Emergency Medicine | Admitting: Emergency Medicine

## 2013-11-25 ENCOUNTER — Emergency Department (HOSPITAL_COMMUNITY): Payer: Medicare Other

## 2013-11-25 ENCOUNTER — Encounter (HOSPITAL_COMMUNITY): Payer: Self-pay | Admitting: Emergency Medicine

## 2013-11-25 DIAGNOSIS — I509 Heart failure, unspecified: Secondary | ICD-10-CM | POA: Insufficient documentation

## 2013-11-25 DIAGNOSIS — Z880 Allergy status to penicillin: Secondary | ICD-10-CM | POA: Diagnosis not present

## 2013-11-25 DIAGNOSIS — Z8639 Personal history of other endocrine, nutritional and metabolic disease: Secondary | ICD-10-CM | POA: Insufficient documentation

## 2013-11-25 DIAGNOSIS — J449 Chronic obstructive pulmonary disease, unspecified: Secondary | ICD-10-CM | POA: Insufficient documentation

## 2013-11-25 DIAGNOSIS — W57XXXA Bitten or stung by nonvenomous insect and other nonvenomous arthropods, initial encounter: Secondary | ICD-10-CM | POA: Diagnosis not present

## 2013-11-25 DIAGNOSIS — S81009A Unspecified open wound, unspecified knee, initial encounter: Secondary | ICD-10-CM | POA: Diagnosis not present

## 2013-11-25 DIAGNOSIS — L089 Local infection of the skin and subcutaneous tissue, unspecified: Secondary | ICD-10-CM | POA: Diagnosis not present

## 2013-11-25 DIAGNOSIS — I10 Essential (primary) hypertension: Secondary | ICD-10-CM | POA: Insufficient documentation

## 2013-11-25 DIAGNOSIS — J4489 Other specified chronic obstructive pulmonary disease: Secondary | ICD-10-CM | POA: Insufficient documentation

## 2013-11-25 DIAGNOSIS — L97509 Non-pressure chronic ulcer of other part of unspecified foot with unspecified severity: Secondary | ICD-10-CM | POA: Insufficient documentation

## 2013-11-25 DIAGNOSIS — Z862 Personal history of diseases of the blood and blood-forming organs and certain disorders involving the immune mechanism: Secondary | ICD-10-CM | POA: Insufficient documentation

## 2013-11-25 HISTORY — DX: Chronic obstructive pulmonary disease, unspecified: J44.9

## 2013-11-25 HISTORY — DX: Gout, unspecified: M10.9

## 2013-11-25 HISTORY — DX: Heart failure, unspecified: I50.9

## 2013-11-25 HISTORY — DX: Essential (primary) hypertension: I10

## 2013-11-25 HISTORY — DX: Unspecified asthma, uncomplicated: J45.909

## 2013-11-25 LAB — COMPREHENSIVE METABOLIC PANEL
ALT: 17 U/L (ref 0–35)
AST: 21 U/L (ref 0–37)
Albumin: 3.8 g/dL (ref 3.5–5.2)
Alkaline Phosphatase: 81 U/L (ref 39–117)
BUN: 16 mg/dL (ref 6–23)
CALCIUM: 10.1 mg/dL (ref 8.4–10.5)
CO2: 31 mEq/L (ref 19–32)
CREATININE: 1.54 mg/dL — AB (ref 0.50–1.10)
Chloride: 101 mEq/L (ref 96–112)
GFR, EST AFRICAN AMERICAN: 40 mL/min — AB (ref >90)
GFR, EST NON AFRICAN AMERICAN: 35 mL/min — AB (ref >90)
GLUCOSE: 99 mg/dL (ref 70–99)
Potassium: 4.7 mEq/L (ref 3.7–5.3)
SODIUM: 140 meq/L (ref 137–147)
TOTAL PROTEIN: 7.1 g/dL (ref 6.0–8.3)
Total Bilirubin: 0.3 mg/dL (ref 0.3–1.2)

## 2013-11-25 LAB — CBC WITH DIFFERENTIAL/PLATELET
BASOS ABS: 0.1 10*3/uL (ref 0.0–0.1)
Basophils Relative: 1 % (ref 0–1)
EOS ABS: 0.3 10*3/uL (ref 0.0–0.7)
EOS PCT: 5 % (ref 0–5)
HEMATOCRIT: 36.8 % (ref 36.0–46.0)
Hemoglobin: 11.8 g/dL — ABNORMAL LOW (ref 12.0–15.0)
Lymphocytes Relative: 40 % (ref 12–46)
Lymphs Abs: 2.6 10*3/uL (ref 0.7–4.0)
MCH: 30.6 pg (ref 26.0–34.0)
MCHC: 32.1 g/dL (ref 30.0–36.0)
MCV: 95.6 fL (ref 78.0–100.0)
MONO ABS: 0.5 10*3/uL (ref 0.1–1.0)
Monocytes Relative: 7 % (ref 3–12)
Neutro Abs: 3 10*3/uL (ref 1.7–7.7)
Neutrophils Relative %: 47 % (ref 43–77)
PLATELETS: 337 10*3/uL (ref 150–400)
RBC: 3.85 MIL/uL — ABNORMAL LOW (ref 3.87–5.11)
RDW: 14.7 % (ref 11.5–15.5)
WBC: 6.5 10*3/uL (ref 4.0–10.5)

## 2013-11-25 MED ORDER — DOXYCYCLINE HYCLATE 100 MG PO CAPS: 100.0000 mg | ORAL_CAPSULE | Freq: Two times a day (BID) | ORAL | Status: DC

## 2013-11-25 MED ORDER — HYDROCODONE-ACETAMINOPHEN 5-325 MG PO TABS: 2.0000 | ORAL_TABLET | ORAL | Status: DC | PRN

## 2013-11-25 MED ORDER — IOHEXOL 300 MG/ML  SOLN
100.0000 mL | Freq: Once | INTRAMUSCULAR | Status: AC | PRN
  Administered 2013-11-25: 80 mL via INTRAVENOUS

## 2013-11-25 MED ORDER — HYDROCODONE-ACETAMINOPHEN 5-325 MG PO TABS
2.0000 | ORAL_TABLET | Freq: Once | ORAL | Status: AC
  Administered 2013-11-25: 2 via ORAL
  Filled 2013-11-25: qty 2

## 2013-11-25 MED ORDER — VANCOMYCIN HCL IN DEXTROSE 1-5 GM/200ML-% IV SOLN
1000.0000 mg | Freq: Once | INTRAVENOUS | Status: AC
  Administered 2013-11-25: 1000 mg via INTRAVENOUS
  Filled 2013-11-25: qty 200

## 2013-11-25 MED ORDER — CIPROFLOXACIN HCL 500 MG PO TABS
500.0000 mg | ORAL_TABLET | Freq: Two times a day (BID) | ORAL | Status: DC
Start: 2013-11-25 — End: 2023-05-10

## 2013-11-25 MED ORDER — CIPROFLOXACIN IN D5W 400 MG/200ML IV SOLN
400.0000 mg | Freq: Once | INTRAVENOUS | Status: AC
  Administered 2013-11-25: 400 mg via INTRAVENOUS
  Filled 2013-11-25: qty 200

## 2013-11-25 NOTE — ED Notes (Signed)
Pt presents with pain and swelling to left ankle/foot x1 week. Pt states "I think I was bit by a spider last week". Pt was seen at another ED last week and was given abx. Pt states she has finished her meds but swelling has not improved.

## 2013-11-25 NOTE — ED Provider Notes (Signed)
CSN: [id number]     Arrival date & time 11/25/13  1633 History  This chart was scribed for Taylor Essex, MD by Roxan Diesel, ED scribe.  This patient was seen in room APA05/APA05 and the patient's care was started at 6:46 PM.  Chief Complaint  Patient presents with  . Foot Pain    The history is provided by the patient. No language interpreter was used.    HPI Comments: Taylor Shields is a 63 y.o. female with h/o COPD, CHF, asthma and HTN who presents to the Emergency Department complaining of pain to the back of her left ankle that began 1 week ago.  Pt states she thinks she was bit by a spider although she did not see one.  She woke up approximately one week ago and noticed pain and swelling to the area.  This has been constant and progressively worsening since then.  Several days ago the area opened and began draining.  She denies CP, abdominal pain, worsening SOB, or measured fever.  Pt reports that she went to the [facility] ED last week and was prescribed an unknown antibiotic, which she finished with no improvement.  She also went to her PCP 5 days ago and was placed on prednisone which she took without relief.  Pt denies h/o DM or kidney problems.  She walks with a cane at baseline due to gout.  She is allergic to penicillin.  PCP is Dr. Sherrie Sport   Past Medical History  Diagnosis Date  . Hypertension   . Gout   . COPD (chronic obstructive pulmonary disease)   . Asthma   . CHF (congestive heart failure)     Past Surgical History  Procedure Laterality Date  . Lipoma excision    . Colon surgery    . Carpal tunnel release Bilateral   . Partial hysterectomy      Family History  Problem Relation Age of Onset  . Osteoarthritis Mother   . Hypertension Mother     History  Substance Use Topics  . Smoking status: Never Smoker   . Smokeless tobacco: Never Used  . Alcohol Use: No    OB History   Grav Para Term Preterm Abortions TAB SAB Ect Mult Living   2 2 2        2        Review of Systems A complete 10 system review of systems was obtained and all systems are negative except as noted in the HPI and PMH.     Allergies  Penicillins  Home Medications   Prior to Admission medications   Not on File   BP 148/100  Pulse 83  Temp(Src) 97.9 F (36.6 C) (Oral)  Resp 20  Ht 5\' 1"  (1.549 m)  Wt 228 lb (103.42 kg)  BMI 43.10 kg/m2  SpO2 100%  Physical Exam  Nursing note and vitals reviewed. Constitutional: She is oriented to person, place, and time. She appears well-developed and well-nourished. No distress.  HENT:  Head: Normocephalic and atraumatic.  Mouth/Throat: Oropharynx is clear and moist. No oropharyngeal exudate.  Eyes: Conjunctivae and EOM are normal. Pupils are equal, round, and reactive to light.  Neck: Normal range of motion. Neck supple. No tracheal deviation present.  Cardiovascular: Normal rate, regular rhythm and normal heart sounds.   No murmur heard. Pulmonary/Chest: Effort normal. No respiratory distress.  Abdominal: Soft. There is no tenderness. There is no rebound and no guarding.  Musculoskeletal: Normal range of motion. She exhibits tenderness.  Neurological:  She is alert and oriented to person, place, and time. No cranial nerve deficit. She exhibits normal muscle tone.  Skin: Skin is warm and dry.  Foul-smelling ulcer overlying the left achilles, with ruptured overlying bulla, mild surrounding erythema.  Intact DP and PT pulses.  Full ROM of ankle and toes.  Psychiatric: She has a normal mood and affect. Her behavior is normal.    ED Course  Procedures (including critical care time)  DIAGNOSTIC STUDIES: Oxygen Saturation is 100% on room air, normal by my interpretation.    COORDINATION OF CARE: 6:55 PM-Discussed treatment plan which includes antibiotics, imaging, labs and likely admission for further antibiotic treatment with pt at bedside and pt agreed to plan.     Labs Review Labs Reviewed  CBC WITH  DIFFERENTIAL - Abnormal; Notable for the following:    RBC 3.85 (*)    Hemoglobin 11.8 (*)    All other components within normal limits  COMPREHENSIVE METABOLIC PANEL - Abnormal; Notable for the following:    Creatinine, Ser 1.54 (*)    GFR calc non Af Amer 35 (*)    GFR calc Af Amer 40 (*)    All other components within normal limits  CULTURE, BLOOD (ROUTINE X 2)  CULTURE, BLOOD (ROUTINE X 2)    Imaging Review Dg Ankle Complete Left  11/25/2013   CLINICAL DATA:  Inflammation at the site of an insect bite posteriorly over the left ankle  EXAM: LEFT ANKLE COMPLETE - 3+ VIEW  COMPARISON:  None.  FINDINGS: Three views of the left ankle reveal mild diffuse soft tissue swelling. No soft tissue gas collections are demonstrated. The ankle joint mortise is preserved. There are mild degenerative changes noted of the malleoli. No soft tissue foreign bodies are demonstrated.  IMPRESSION: There is mild diffuse soft tissue swelling which may be related the patient's clinical history and findings and reflect cellulitis. There is no soft tissue gas demonstrated. There are mild degenerative changes of the ankle.   Electronically Signed   By: David  Martinique   On: 11/25/2013 17:38   Ct Ankle Left W Contrast  11/25/2013   CLINICAL DATA:  Posterior lower ankle wound for few days.  EXAM: CT OF THE  ANKLE WITH CONTRAST  TECHNIQUE: Multidetector CT imaging of the ankle was performed following the standard protocol during bolus administration of intravenous contrast.  CONTRAST:  77mL OMNIPAQUE IOHEXOL 300 MG/ML  SOLN  COMPARISON:  None.  FINDINGS: There is osteopenia. There are degenerative joint changes of the tarsal bones with osteophyte formation and subchondral cysts. There is no acute fracture or dislocation. There is no evidence of bone destruction to suggest osteomyelitis. There is minimal plantar calcaneal spur There is generalized mild soft tissue swelling around the ankle. There is no focal fluid collection.   IMPRESSION: No definite evidence of abscess. Mild generalized soft tissue swelling of the ankle. Degenerative joint changes of the tarsal bones. There is no bone destruction to suggest osteomyelitis.   Electronically Signed   By: Abelardo Diesel M.D.   On: 11/25/2013 20:54     EKG Interpretation None      MDM   Final diagnoses:  Foot ulcer   Patient with pain to the back of her left ankle for the past 2 weeks after "spider bite". There is a swelling and foul-smelling discharge. She was on unknown antibiotic that she finished one week ago. Denies fevers she is not diabetic.  X-rays show soft tissue swelling. Patient is afebrile. No leukocytosis.  There is not much appreciable cellulitis on exam. Patient has a foul-smelling ulceration overlying the area of her Achilles tendon. There is no visible. Motor and sensation are intact.   CT scan is not show any evidence of abscess or osteomyelitis. Patient appears well. Will restart antibiotics, advised followup for wound care and recheck in 48 hours.   patient was hesitant to go home. Case was discussed with Dr. Dyann Kief. He agrees there does not appear to be an indication for admission. Patient is nontoxic-appearing she is afebrile has no leukocytosis. Creatinine stable.  She'll be instructed on wound care, restart it on a course of doxycycline and Cipro followup in 48 hours with her PCP or in the ER if she is unable to see her PCP.  I personally performed the services described in this documentation, which was scribed in my presence. The recorded information has been reviewed and is accurate.   Taylor Essex, MD 11/25/13 [phone]

## 2013-11-25 NOTE — ED Notes (Addendum)
Patient c/o ? spider bite to back of left heel/foot pain x2 weeks ago. Patient c/o swelling and pain that has progressively gotten worse. Per patient felt pain in back of heel but did not see what it was. Per patient went to ER x1 week ago, given antibiotic. Per patient then seen by Dr Sherrie Sport and given prednisone. Per patient not getting any better. Reports going to [facility] ER yesterday but states "doctor didn't even look at it." Swelling noted.

## 2013-11-25 NOTE — Discharge Instructions (Signed)
Wound Care Take the antibiotics as prescribed. Follow up with Dr. Dagoberto Ligas in 2 days.  Follow up in the ED if you are unable to see him or develop any new or worsening symptoms. Wound care helps prevent pain and infection.  You may need a tetanus shot if:  You cannot remember when you had your last tetanus shot.  You have never had a tetanus shot.  The injury broke your skin. If you need a tetanus shot and you choose not to have one, you may get tetanus. Sickness from tetanus can be serious. HOME CARE   Only take medicine as told by your doctor.  Clean the wound daily with mild soap and water.  Change any bandages (dressings) as told by your doctor.  Put medicated cream and a bandage on the wound as told by your doctor.  Change the bandage if it gets wet, dirty, or starts to smell.  Take showers. Do not take baths, swim, or do anything that puts your wound under water.  Rest and raise (elevate) the wound until the pain and puffiness (swelling) are better.  Keep all doctor visits as told. GET HELP RIGHT AWAY IF:   Yellowish-white fluid (pus) comes from the wound.  Medicine does not lessen your pain.  There is a red streak going away from the wound.  You have a fever. MAKE SURE YOU:   Understand these instructions.  Will watch your condition.  Will get help right away if you are not doing well or get worse. Document Released: 04/07/2008 Document Revised: 09/21/2011 Document Reviewed: 11/02/2010 [facility] Patient Information 2014 [place], [place].

## 2013-11-27 ENCOUNTER — Encounter (HOSPITAL_COMMUNITY): Payer: Self-pay | Admitting: Emergency Medicine

## 2013-11-27 ENCOUNTER — Emergency Department (HOSPITAL_COMMUNITY)
Admission: EM | Admit: 2013-11-27 | Discharge: 2013-11-27 | Disposition: A | Discharge status: Home / Self Care | Payer: Medicare Other | Attending: Emergency Medicine | Admitting: Emergency Medicine

## 2013-11-27 DIAGNOSIS — I509 Heart failure, unspecified: Secondary | ICD-10-CM | POA: Insufficient documentation

## 2013-11-27 DIAGNOSIS — R509 Fever, unspecified: Secondary | ICD-10-CM | POA: Insufficient documentation

## 2013-11-27 DIAGNOSIS — R110 Nausea: Secondary | ICD-10-CM | POA: Diagnosis not present

## 2013-11-27 DIAGNOSIS — J449 Chronic obstructive pulmonary disease, unspecified: Secondary | ICD-10-CM | POA: Diagnosis not present

## 2013-11-27 DIAGNOSIS — J4489 Other specified chronic obstructive pulmonary disease: Secondary | ICD-10-CM | POA: Insufficient documentation

## 2013-11-27 DIAGNOSIS — Z79899 Other long term (current) drug therapy: Secondary | ICD-10-CM | POA: Insufficient documentation

## 2013-11-27 DIAGNOSIS — Z862 Personal history of diseases of the blood and blood-forming organs and certain disorders involving the immune mechanism: Secondary | ICD-10-CM | POA: Diagnosis not present

## 2013-11-27 DIAGNOSIS — L97309 Non-pressure chronic ulcer of unspecified ankle with unspecified severity: Secondary | ICD-10-CM | POA: Insufficient documentation

## 2013-11-27 DIAGNOSIS — Z880 Allergy status to penicillin: Secondary | ICD-10-CM | POA: Insufficient documentation

## 2013-11-27 DIAGNOSIS — I10 Essential (primary) hypertension: Secondary | ICD-10-CM | POA: Diagnosis not present

## 2013-11-27 DIAGNOSIS — Z792 Long term (current) use of antibiotics: Secondary | ICD-10-CM | POA: Diagnosis not present

## 2013-11-27 DIAGNOSIS — Z8639 Personal history of other endocrine, nutritional and metabolic disease: Secondary | ICD-10-CM | POA: Insufficient documentation

## 2013-11-27 LAB — CBC WITH DIFFERENTIAL/PLATELET
BASOS PCT: 1 % (ref 0–1)
Basophils Absolute: 0.1 10*3/uL (ref 0.0–0.1)
EOS ABS: 0.2 10*3/uL (ref 0.0–0.7)
EOS PCT: 3 % (ref 0–5)
HEMATOCRIT: 39 % (ref 36.0–46.0)
HEMOGLOBIN: 12.4 g/dL (ref 12.0–15.0)
LYMPHS ABS: 2.3 10*3/uL (ref 0.7–4.0)
Lymphocytes Relative: 34 % (ref 12–46)
MCH: 30 pg (ref 26.0–34.0)
MCHC: 31.8 g/dL (ref 30.0–36.0)
MCV: 94.2 fL (ref 78.0–100.0)
MONO ABS: 0.6 10*3/uL (ref 0.1–1.0)
MONOS PCT: 9 % (ref 3–12)
NEUTROS PCT: 53 % (ref 43–77)
Neutro Abs: 3.7 10*3/uL (ref 1.7–7.7)
Platelets: 362 10*3/uL (ref 150–400)
RBC: 4.14 MIL/uL (ref 3.87–5.11)
RDW: 14.3 % (ref 11.5–15.5)
WBC: 6.9 10*3/uL (ref 4.0–10.5)

## 2013-11-27 LAB — BASIC METABOLIC PANEL
BUN: 12 mg/dL (ref 6–23)
CALCIUM: 10.1 mg/dL (ref 8.4–10.5)
CO2: 30 mEq/L (ref 19–32)
CREATININE: 1.16 mg/dL — AB (ref 0.50–1.10)
Chloride: 94 mEq/L — ABNORMAL LOW (ref 96–112)
GFR, EST AFRICAN AMERICAN: 56 mL/min — AB (ref >90)
GFR, EST NON AFRICAN AMERICAN: 49 mL/min — AB (ref >90)
GLUCOSE: 98 mg/dL (ref 70–99)
POTASSIUM: 4.2 meq/L (ref 3.7–5.3)
Sodium: 135 mEq/L — ABNORMAL LOW (ref 137–147)

## 2013-11-27 MED ORDER — ONDANSETRON 8 MG PO TBDP: 8.0000 mg | ORAL_TABLET | Freq: Three times a day (TID) | ORAL | Status: DC | PRN

## 2013-11-27 MED ORDER — ONDANSETRON HCL 4 MG/2ML IJ SOLN
4.0000 mg | Freq: Once | INTRAMUSCULAR | Status: AC
  Administered 2013-11-27: 4 mg via INTRAVENOUS
  Filled 2013-11-27: qty 2

## 2013-11-27 MED ORDER — SODIUM CHLORIDE 0.9 % IV SOLN
INTRAVENOUS | Status: DC
  Administered 2013-11-27: 19:00:00 via INTRAVENOUS

## 2013-11-27 MED ORDER — MORPHINE SULFATE 4 MG/ML IJ SOLN
4.0000 mg | Freq: Once | INTRAMUSCULAR | Status: AC
  Administered 2013-11-27: 4 mg via INTRAVENOUS
  Filled 2013-11-27: qty 1

## 2013-11-27 NOTE — Discharge Instructions (Signed)
Continue taking your antibiotics as directed. Return here for fever, red streaks extending up the calf, worsening pain, or any other problems Skin Ulcer A skin ulcer is an open sore that can be shallow or deep. Skin ulcers sometimes become infected and are difficult to treat. It may be 1 month or longer before real healing progress is made. CAUSES   Injury.  Problems with the veins or arteries.  Diabetes.  Insect bites.  Bedsores.  Inflammatory conditions. SYMPTOMS   Pain, redness, swelling, and tenderness around the ulcer.  Fever.  Bleeding from the ulcer.  Yellow or clear fluid coming from the ulcer. DIAGNOSIS  There are many types of skin ulcers. Any open sores will be examined. Certain tests will be done to determine the kind of ulcer you have. The right treatment depends on the type of ulcer you have. TREATMENT  Treatment is a long-term challenge. It may include:  Wearing an elastic wrap, compression stockings, or gel cast over the ulcer area.  Taking antibiotic medicines or putting antibiotic creams on the affected area if there is an infection. HOME CARE INSTRUCTIONS  Put on your bandages (dressings), wraps, or casts over the ulcer as directed by your caregiver.  Change all dressings as directed by your caregiver.  Take all medicines as directed by your caregiver.  Keep the affected area clean and dry.  Avoid injuries to the affected area.  Eat a well-balanced, healthy diet that includes plenty of fruit and vegetables.  If you smoke, consider quitting or decreasing the amount of cigarettes you smoke.  Once the ulcer heals, get regular exercise as directed by your caregiver.  Work with your caregiver to make sure your blood pressure, cholesterol, and diabetes are well-controlled.  Keep your skin moisturized. Dry skin can crack and lead to skin ulcers. SEEK IMMEDIATE MEDICAL CARE IF:   Your pain gets worse.  You have swelling, redness, or fluids around  the ulcer.  You have chills.  You have a fever. MAKE SURE YOU:   Understand these instructions.  Will watch your condition.  Will get help right away if you are not doing well or get worse. Document Released: 08/06/2004 Document Revised: 09/21/2011 Document Reviewed: 02/13/2011 [facility] Patient Information 2014 [place], [place].

## 2013-11-27 NOTE — ED Provider Notes (Addendum)
CSN: [id number]     Arrival date & time 11/27/13  1723 History   First MD Initiated Contact with Patient 11/27/13 1804 This chart was scribed for Leota Jacobsen, MD by Anastasia Pall, ED Scribe. This patient was seen in room APA09/APA09 and the patient's care was started at 6:15 PM.    No chief complaint on file.  (Consider location/radiation/quality/duration/timing/severity/associated sxs/prior Treatment) The history is provided by the patient. No language interpreter was used.   HPI Comments: Taylor Shields is a 63 y.o. female who presents to the Emergency Department complaining of gradually worsening, constant pain from an open wound over her left heel, onset 2 weeks ago. Pt suspects spider bite origin. Pt reports associated subjective low grade fever and nausea within the last 24 hours. Relative reports pt has not had much to eat today. Pt reports being seen at [facility] 3 days ago, was given antibiotics. Pt denies vomiting and diarrhea in the last 24 hours, and any other associated symptoms. Pt denies h/o DM. Patient symptoms are persistent and nothing makes them better or worse.  PCP - Neale Burly, MD  Past Medical History  Diagnosis Date  . Hypertension   . Gout   . COPD (chronic obstructive pulmonary disease)   . Asthma   . CHF (congestive heart failure)    Past Surgical History  Procedure Laterality Date  . Lipoma excision    . Colon surgery    . Carpal tunnel release Bilateral   . Partial hysterectomy    . Abdominal hysterectomy     Family History  Problem Relation Age of Onset  . Osteoarthritis Mother   . Hypertension Mother    History  Substance Use Topics  . Smoking status: Never Smoker   . Smokeless tobacco: Never Used  . Alcohol Use: No   OB History   Grav Para Term Preterm Abortions TAB SAB Ect Mult Living   2 2 2       2      Review of Systems  Constitutional: Positive for fever (subjective).  Gastrointestinal: Positive for nausea. Negative  for vomiting and diarrhea.  Skin: Positive for wound (over left heel).   Allergies  Penicillins  Home Medications   Prior to Admission medications   Medication Sig Start Date End Date Taking? Authorizing Provider  albuterol (PROAIR HFA) 108 (90 BASE) MCG/ACT inhaler Inhale 1 puff into the lungs every 6 (six) hours as needed for wheezing or shortness of breath.    Historical Provider, MD  ciprofloxacin (CIPRO) 500 MG tablet Take 1 tablet (500 mg total) by mouth every 12 (twelve) hours. 11/25/13   [name], MD  cloNIDine (CATAPRES) 0.2 MG tablet Take 0.2 mg by mouth 2 (two) times daily.    Historical Provider, MD  DiphenhydrAMINE HCl (ALLERGY MEDICATION PO) Take 1 tablet by mouth daily.    Historical Provider, MD  doxycycline (VIBRAMYCIN) 100 MG capsule Take 1 capsule (100 mg total) by mouth 2 (two) times daily. 11/25/13   [name], MD  esomeprazole (NEXIUM) 40 MG capsule Take 40 mg by mouth daily at 12 noon.    Historical Provider, MD  guaiFENesin (MUCINEX) 600 MG 12 hr tablet Take 1,200 mg by mouth 2 (two) times daily.    Historical Provider, MD  HYDROcodone-acetaminophen (NORCO/VICODIN) 5-325 MG per tablet Take 2 tablets by mouth every 4 (four) hours as needed. 11/25/13   [name], MD  labetalol (NORMODYNE) 300 MG tablet Take 300 mg by mouth 2 (two) times daily.  Historical Provider, MD  potassium chloride SA (K-DUR,KLOR-CON) 20 MEQ tablet Take 20 mEq by mouth 2 (two) times daily.    Historical Provider, MD  simvastatin (ZOCOR) 20 MG tablet Take 20 mg by mouth daily.    Historical Provider, MD   BP 134/100  Pulse 104  Temp(Src) 97.8 F (36.6 C) (Oral)  Resp 22  Ht 5\' 1"  (1.549 m)  Wt 228 lb (103.42 kg)  BMI 43.10 kg/m2  SpO2 98%  Physical Exam  Nursing note and vitals reviewed. Constitutional: She is oriented to person, place, and time. She appears well-developed and well-nourished.  Non-toxic appearance. No distress.  HENT:  Head: Normocephalic and  atraumatic.  Eyes: Conjunctivae, EOM and lids are normal. Pupils are equal, round, and reactive to light.  Neck: Neck supple. No mass present.  Cardiovascular: Normal rate.   Pulmonary/Chest: Effort normal. She has no decreased breath sounds. She has no rhonchi.  Abdominal: Soft. Normal appearance. There is no CVA tenderness.  Musculoskeletal: Normal range of motion. She exhibits no edema and no tenderness.  Neurological: She is alert and oriented to person, place, and time. She has normal strength. No cranial nerve deficit or sensory deficit. GCS eye subscore is 4. GCS verbal subscore is 5. GCS motor subscore is 6.  Skin: Skin is warm and dry. No abrasion noted. No erythema.  Approximately 2 cm round ulcer mid portion of left achilles. No erythema to the skin. No active drainage. No crepitus. Pain with movement of foot. No lymphangitis extending up left calf.   Psychiatric: She has a normal mood and affect. Her speech is normal and behavior is normal.   ED Course  Procedures (including critical care time)  DIAGNOSTIC STUDIES: Oxygen Saturation is 98% on room air, normal by my interpretation.    COORDINATION OF CARE: 6:23 PM-Discussed treatment plan which includes blood work and DG left foot with pt at bedside and pt agreed to plan. Will discharge pt pending lab and imaging results.   Labs Review Results for orders placed during the hospital encounter of 11/27/13  CBC WITH DIFFERENTIAL      Result Value Ref Range   WBC 6.9  4.0 - 10.5 K/uL   RBC 4.14  3.87 - 5.11 MIL/uL   Hemoglobin 12.4  12.0 - 15.0 g/dL   HCT 39.0  36.0 - 46.0 %   MCV 94.2  78.0 - 100.0 fL   MCH 30.0  26.0 - 34.0 pg   MCHC 31.8  30.0 - 36.0 g/dL   RDW 14.3  11.5 - 15.5 %   Platelets 362  150 - 400 K/uL   Neutrophils Relative % 53  43 - 77 %   Neutro Abs 3.7  1.7 - 7.7 K/uL   Lymphocytes Relative 34  12 - 46 %   Lymphs Abs 2.3  0.7 - 4.0 K/uL   Monocytes Relative 9  3 - 12 %   Monocytes Absolute 0.6  0.1 - 1.0  K/uL   Eosinophils Relative 3  0 - 5 %   Eosinophils Absolute 0.2  0.0 - 0.7 K/uL   Basophils Relative 1  0 - 1 %   Basophils Absolute 0.1  0.0 - 0.1 K/uL  BASIC METABOLIC PANEL      Result Value Ref Range   Sodium 135 (*) 137 - 147 mEq/L   Potassium 4.2  3.7 - 5.3 mEq/L   Chloride 94 (*) 96 - 112 mEq/L   CO2 30  19 - 32 mEq/L  Glucose, Bld 98  70 - 99 mg/dL   BUN 12  6 - 23 mg/dL   Creatinine, Ser 1.16 (*) 0.50 - 1.10 mg/dL   Calcium 10.1  8.4 - 10.5 mg/dL   GFR calc non Af Amer 49 (*) >90 mL/min   GFR calc Af Amer 56 (*) >90 mL/min   Dg Ankle Complete Left  11/25/2013   CLINICAL DATA:  Inflammation at the site of an insect bite posteriorly over the left ankle  EXAM: LEFT ANKLE COMPLETE - 3+ VIEW  COMPARISON:  None.  FINDINGS: Three views of the left ankle reveal mild diffuse soft tissue swelling. No soft tissue gas collections are demonstrated. The ankle joint mortise is preserved. There are mild degenerative changes noted of the malleoli. No soft tissue foreign bodies are demonstrated.  IMPRESSION: There is mild diffuse soft tissue swelling which may be related the patient's clinical history and findings and reflect cellulitis. There is no soft tissue gas demonstrated. There are mild degenerative changes of the ankle.   Electronically Signed   By: David  Martinique   On: 11/25/2013 17:38   Ct Ankle Left W Contrast  11/25/2013   CLINICAL DATA:  Posterior lower ankle wound for few days.  EXAM: CT OF THE  ANKLE WITH CONTRAST  TECHNIQUE: Multidetector CT imaging of the ankle was performed following the standard protocol during bolus administration of intravenous contrast.  CONTRAST:  54mL OMNIPAQUE IOHEXOL 300 MG/ML  SOLN  COMPARISON:  None.  FINDINGS: There is osteopenia. There are degenerative joint changes of the tarsal bones with osteophyte formation and subchondral cysts. There is no acute fracture or dislocation. There is no evidence of bone destruction to suggest osteomyelitis. There is  minimal plantar calcaneal spur There is generalized mild soft tissue swelling around the ankle. There is no focal fluid collection.  IMPRESSION: No definite evidence of abscess. Mild generalized soft tissue swelling of the ankle. Degenerative joint changes of the tarsal bones. There is no bone destruction to suggest osteomyelitis.   Electronically Signed   By: Abelardo Diesel M.D.   On: 11/25/2013 20:54     EKG Interpretation None     Medications  0.9 %  sodium chloride infusion ( Intravenous New Bag/Given 11/27/13 1839)  ondansetron (ZOFRAN) injection 4 mg (4 mg Intravenous Given 11/27/13 1839)   MDM   Final diagnoses:  None    Patient given IV fluids and pain meds here. She is on adequate antibiotic coverage. No evidence of systemic infection at this time. Patient had a CT to rule out osteomyelitis and abscess 2 days ago. She is stable for discharge  Leota Jacobsen, MD 11/27/13 2017  Leota Jacobsen, MD 11/27/13 2021

## 2013-11-27 NOTE — ED Notes (Signed)
Seen here 5/16 for ?spider bite to lt foot/ankle region. Returned due to n/v/d.  Alert,   Taking antibiotics.

## 2013-11-29 DIAGNOSIS — I83009 Varicose veins of unspecified lower extremity with ulcer of unspecified site: Secondary | ICD-10-CM | POA: Diagnosis not present

## 2013-11-30 LAB — CULTURE, BLOOD (ROUTINE X 2)
CULTURE: NO GROWTH
CULTURE: NO GROWTH

## 2013-12-07 DIAGNOSIS — L97809 Non-pressure chronic ulcer of other part of unspecified lower leg with unspecified severity: Secondary | ICD-10-CM | POA: Diagnosis not present

## 2013-12-07 DIAGNOSIS — L97929 Non-pressure chronic ulcer of unspecified part of left lower leg with unspecified severity: Secondary | ICD-10-CM | POA: Diagnosis not present

## 2013-12-07 DIAGNOSIS — M109 Gout, unspecified: Secondary | ICD-10-CM | POA: Diagnosis not present

## 2013-12-07 DIAGNOSIS — I83219 Varicose veins of right lower extremity with both ulcer of unspecified site and inflammation: Secondary | ICD-10-CM | POA: Diagnosis not present

## 2013-12-07 DIAGNOSIS — Z8249 Family history of ischemic heart disease and other diseases of the circulatory system: Secondary | ICD-10-CM | POA: Diagnosis not present

## 2013-12-07 DIAGNOSIS — M069 Rheumatoid arthritis, unspecified: Secondary | ICD-10-CM | POA: Diagnosis not present

## 2013-12-07 DIAGNOSIS — I872 Venous insufficiency (chronic) (peripheral): Secondary | ICD-10-CM | POA: Diagnosis not present

## 2013-12-07 DIAGNOSIS — K219 Gastro-esophageal reflux disease without esophagitis: Secondary | ICD-10-CM | POA: Diagnosis not present

## 2013-12-07 DIAGNOSIS — I10 Essential (primary) hypertension: Secondary | ICD-10-CM | POA: Diagnosis not present

## 2013-12-07 DIAGNOSIS — L97919 Non-pressure chronic ulcer of unspecified part of right lower leg with unspecified severity: Secondary | ICD-10-CM | POA: Diagnosis not present

## 2013-12-12 DIAGNOSIS — L97909 Non-pressure chronic ulcer of unspecified part of unspecified lower leg with unspecified severity: Secondary | ICD-10-CM | POA: Diagnosis not present

## 2013-12-12 DIAGNOSIS — I872 Venous insufficiency (chronic) (peripheral): Secondary | ICD-10-CM | POA: Diagnosis not present

## 2013-12-12 DIAGNOSIS — L97509 Non-pressure chronic ulcer of other part of unspecified foot with unspecified severity: Secondary | ICD-10-CM | POA: Diagnosis not present

## 2013-12-14 DIAGNOSIS — L97409 Non-pressure chronic ulcer of unspecified heel and midfoot with unspecified severity: Secondary | ICD-10-CM | POA: Diagnosis not present

## 2013-12-14 DIAGNOSIS — I83219 Varicose veins of right lower extremity with both ulcer of unspecified site and inflammation: Secondary | ICD-10-CM | POA: Diagnosis not present

## 2013-12-14 DIAGNOSIS — L97919 Non-pressure chronic ulcer of unspecified part of right lower leg with unspecified severity: Secondary | ICD-10-CM | POA: Diagnosis not present

## 2013-12-14 DIAGNOSIS — IMO0001 Reserved for inherently not codable concepts without codable children: Secondary | ICD-10-CM | POA: Diagnosis not present

## 2013-12-14 DIAGNOSIS — I872 Venous insufficiency (chronic) (peripheral): Secondary | ICD-10-CM | POA: Diagnosis not present

## 2013-12-21 DIAGNOSIS — I872 Venous insufficiency (chronic) (peripheral): Secondary | ICD-10-CM | POA: Diagnosis not present

## 2013-12-21 DIAGNOSIS — L97929 Non-pressure chronic ulcer of unspecified part of left lower leg with unspecified severity: Secondary | ICD-10-CM | POA: Diagnosis not present

## 2013-12-21 DIAGNOSIS — L97409 Non-pressure chronic ulcer of unspecified heel and midfoot with unspecified severity: Secondary | ICD-10-CM | POA: Diagnosis not present

## 2013-12-21 DIAGNOSIS — IMO0001 Reserved for inherently not codable concepts without codable children: Secondary | ICD-10-CM | POA: Diagnosis not present

## 2013-12-21 DIAGNOSIS — I83219 Varicose veins of right lower extremity with both ulcer of unspecified site and inflammation: Secondary | ICD-10-CM | POA: Diagnosis not present

## 2013-12-28 DIAGNOSIS — I83219 Varicose veins of right lower extremity with both ulcer of unspecified site and inflammation: Secondary | ICD-10-CM | POA: Diagnosis not present

## 2013-12-28 DIAGNOSIS — IMO0001 Reserved for inherently not codable concepts without codable children: Secondary | ICD-10-CM | POA: Diagnosis not present

## 2013-12-28 DIAGNOSIS — L97409 Non-pressure chronic ulcer of unspecified heel and midfoot with unspecified severity: Secondary | ICD-10-CM | POA: Diagnosis not present

## 2013-12-28 DIAGNOSIS — L97929 Non-pressure chronic ulcer of unspecified part of left lower leg with unspecified severity: Secondary | ICD-10-CM | POA: Diagnosis not present

## 2013-12-28 DIAGNOSIS — I872 Venous insufficiency (chronic) (peripheral): Secondary | ICD-10-CM | POA: Diagnosis not present

## 2013-12-31 DIAGNOSIS — I4891 Unspecified atrial fibrillation: Secondary | ICD-10-CM | POA: Diagnosis not present

## 2013-12-31 DIAGNOSIS — J45901 Unspecified asthma with (acute) exacerbation: Secondary | ICD-10-CM | POA: Diagnosis not present

## 2013-12-31 DIAGNOSIS — R32 Unspecified urinary incontinence: Secondary | ICD-10-CM | POA: Diagnosis not present

## 2013-12-31 DIAGNOSIS — M159 Polyosteoarthritis, unspecified: Secondary | ICD-10-CM | POA: Diagnosis not present

## 2014-01-04 DIAGNOSIS — L97409 Non-pressure chronic ulcer of unspecified heel and midfoot with unspecified severity: Secondary | ICD-10-CM | POA: Diagnosis not present

## 2014-01-04 DIAGNOSIS — IMO0001 Reserved for inherently not codable concepts without codable children: Secondary | ICD-10-CM | POA: Diagnosis not present

## 2014-01-04 DIAGNOSIS — I83219 Varicose veins of right lower extremity with both ulcer of unspecified site and inflammation: Secondary | ICD-10-CM | POA: Diagnosis not present

## 2014-01-04 DIAGNOSIS — I872 Venous insufficiency (chronic) (peripheral): Secondary | ICD-10-CM | POA: Diagnosis not present

## 2014-01-04 DIAGNOSIS — L97929 Non-pressure chronic ulcer of unspecified part of left lower leg with unspecified severity: Secondary | ICD-10-CM | POA: Diagnosis not present

## 2014-01-11 DIAGNOSIS — I83219 Varicose veins of right lower extremity with both ulcer of unspecified site and inflammation: Secondary | ICD-10-CM | POA: Diagnosis not present

## 2014-01-11 DIAGNOSIS — L97929 Non-pressure chronic ulcer of unspecified part of left lower leg with unspecified severity: Secondary | ICD-10-CM | POA: Diagnosis not present

## 2014-01-11 DIAGNOSIS — I872 Venous insufficiency (chronic) (peripheral): Secondary | ICD-10-CM | POA: Diagnosis not present

## 2014-01-11 DIAGNOSIS — L97409 Non-pressure chronic ulcer of unspecified heel and midfoot with unspecified severity: Secondary | ICD-10-CM | POA: Diagnosis not present

## 2014-01-16 DIAGNOSIS — I872 Venous insufficiency (chronic) (peripheral): Secondary | ICD-10-CM | POA: Diagnosis not present

## 2014-01-16 DIAGNOSIS — L97409 Non-pressure chronic ulcer of unspecified heel and midfoot with unspecified severity: Secondary | ICD-10-CM | POA: Diagnosis not present

## 2014-01-18 DIAGNOSIS — J329 Chronic sinusitis, unspecified: Secondary | ICD-10-CM | POA: Diagnosis not present

## 2014-01-25 DIAGNOSIS — L97929 Non-pressure chronic ulcer of unspecified part of left lower leg with unspecified severity: Secondary | ICD-10-CM | POA: Diagnosis not present

## 2014-01-25 DIAGNOSIS — I83219 Varicose veins of right lower extremity with both ulcer of unspecified site and inflammation: Secondary | ICD-10-CM | POA: Diagnosis not present

## 2014-01-25 DIAGNOSIS — L97409 Non-pressure chronic ulcer of unspecified heel and midfoot with unspecified severity: Secondary | ICD-10-CM | POA: Diagnosis not present

## 2014-01-25 DIAGNOSIS — I872 Venous insufficiency (chronic) (peripheral): Secondary | ICD-10-CM | POA: Diagnosis not present

## 2014-02-01 DIAGNOSIS — I83229 Varicose veins of left lower extremity with both ulcer of unspecified site and inflammation: Secondary | ICD-10-CM | POA: Diagnosis not present

## 2014-02-01 DIAGNOSIS — I83219 Varicose veins of right lower extremity with both ulcer of unspecified site and inflammation: Secondary | ICD-10-CM | POA: Diagnosis not present

## 2014-02-01 DIAGNOSIS — L97929 Non-pressure chronic ulcer of unspecified part of left lower leg with unspecified severity: Secondary | ICD-10-CM | POA: Diagnosis not present

## 2014-02-01 DIAGNOSIS — I872 Venous insufficiency (chronic) (peripheral): Secondary | ICD-10-CM | POA: Diagnosis not present

## 2014-02-01 DIAGNOSIS — L97409 Non-pressure chronic ulcer of unspecified heel and midfoot with unspecified severity: Secondary | ICD-10-CM | POA: Diagnosis not present

## 2014-02-08 DIAGNOSIS — L97409 Non-pressure chronic ulcer of unspecified heel and midfoot with unspecified severity: Secondary | ICD-10-CM | POA: Diagnosis not present

## 2014-02-08 DIAGNOSIS — I83219 Varicose veins of right lower extremity with both ulcer of unspecified site and inflammation: Secondary | ICD-10-CM | POA: Diagnosis not present

## 2014-02-08 DIAGNOSIS — I872 Venous insufficiency (chronic) (peripheral): Secondary | ICD-10-CM | POA: Diagnosis not present

## 2014-02-08 DIAGNOSIS — L97929 Non-pressure chronic ulcer of unspecified part of left lower leg with unspecified severity: Secondary | ICD-10-CM | POA: Diagnosis not present

## 2014-02-08 DIAGNOSIS — L97919 Non-pressure chronic ulcer of unspecified part of right lower leg with unspecified severity: Secondary | ICD-10-CM | POA: Diagnosis not present

## 2014-02-10 DIAGNOSIS — I4891 Unspecified atrial fibrillation: Secondary | ICD-10-CM | POA: Diagnosis not present

## 2014-02-10 DIAGNOSIS — M159 Polyosteoarthritis, unspecified: Secondary | ICD-10-CM | POA: Diagnosis not present

## 2014-02-10 DIAGNOSIS — R32 Unspecified urinary incontinence: Secondary | ICD-10-CM | POA: Diagnosis not present

## 2014-02-10 DIAGNOSIS — J45901 Unspecified asthma with (acute) exacerbation: Secondary | ICD-10-CM | POA: Diagnosis not present

## 2014-02-15 DIAGNOSIS — I872 Venous insufficiency (chronic) (peripheral): Secondary | ICD-10-CM | POA: Diagnosis not present

## 2014-02-15 DIAGNOSIS — I83219 Varicose veins of right lower extremity with both ulcer of unspecified site and inflammation: Secondary | ICD-10-CM | POA: Diagnosis not present

## 2014-02-15 DIAGNOSIS — I8390 Asymptomatic varicose veins of unspecified lower extremity: Secondary | ICD-10-CM | POA: Diagnosis not present

## 2014-02-15 DIAGNOSIS — L97929 Non-pressure chronic ulcer of unspecified part of left lower leg with unspecified severity: Secondary | ICD-10-CM | POA: Diagnosis not present

## 2014-03-01 DIAGNOSIS — K219 Gastro-esophageal reflux disease without esophagitis: Secondary | ICD-10-CM | POA: Diagnosis not present

## 2014-03-01 DIAGNOSIS — L97929 Non-pressure chronic ulcer of unspecified part of left lower leg with unspecified severity: Secondary | ICD-10-CM | POA: Diagnosis not present

## 2014-03-01 DIAGNOSIS — J449 Chronic obstructive pulmonary disease, unspecified: Secondary | ICD-10-CM | POA: Diagnosis not present

## 2014-03-01 DIAGNOSIS — Z87891 Personal history of nicotine dependence: Secondary | ICD-10-CM | POA: Diagnosis not present

## 2014-03-01 DIAGNOSIS — I83219 Varicose veins of right lower extremity with both ulcer of unspecified site and inflammation: Secondary | ICD-10-CM | POA: Diagnosis not present

## 2014-03-01 DIAGNOSIS — I509 Heart failure, unspecified: Secondary | ICD-10-CM | POA: Diagnosis not present

## 2014-03-01 DIAGNOSIS — R51 Headache: Secondary | ICD-10-CM | POA: Diagnosis not present

## 2014-03-01 DIAGNOSIS — I10 Essential (primary) hypertension: Secondary | ICD-10-CM | POA: Diagnosis not present

## 2014-03-01 DIAGNOSIS — Z79899 Other long term (current) drug therapy: Secondary | ICD-10-CM | POA: Diagnosis not present

## 2014-03-01 DIAGNOSIS — E7800 Pure hypercholesterolemia, unspecified: Secondary | ICD-10-CM | POA: Diagnosis not present

## 2014-03-01 DIAGNOSIS — Z8249 Family history of ischemic heart disease and other diseases of the circulatory system: Secondary | ICD-10-CM | POA: Diagnosis not present

## 2014-03-01 DIAGNOSIS — J441 Chronic obstructive pulmonary disease with (acute) exacerbation: Secondary | ICD-10-CM | POA: Diagnosis not present

## 2014-03-22 DIAGNOSIS — J329 Chronic sinusitis, unspecified: Secondary | ICD-10-CM | POA: Diagnosis not present

## 2014-04-17 DIAGNOSIS — Z8249 Family history of ischemic heart disease and other diseases of the circulatory system: Secondary | ICD-10-CM | POA: Diagnosis not present

## 2014-04-17 DIAGNOSIS — K219 Gastro-esophageal reflux disease without esophagitis: Secondary | ICD-10-CM | POA: Diagnosis not present

## 2014-04-17 DIAGNOSIS — Z79899 Other long term (current) drug therapy: Secondary | ICD-10-CM | POA: Diagnosis not present

## 2014-04-17 DIAGNOSIS — I10 Essential (primary) hypertension: Secondary | ICD-10-CM | POA: Diagnosis not present

## 2014-04-17 DIAGNOSIS — Z7951 Long term (current) use of inhaled steroids: Secondary | ICD-10-CM | POA: Diagnosis not present

## 2014-04-17 DIAGNOSIS — I509 Heart failure, unspecified: Secondary | ICD-10-CM | POA: Diagnosis not present

## 2014-04-17 DIAGNOSIS — Z87891 Personal history of nicotine dependence: Secondary | ICD-10-CM | POA: Diagnosis not present

## 2014-04-17 DIAGNOSIS — M791 Myalgia: Secondary | ICD-10-CM | POA: Diagnosis not present

## 2014-04-17 DIAGNOSIS — J45909 Unspecified asthma, uncomplicated: Secondary | ICD-10-CM | POA: Diagnosis not present

## 2014-04-17 DIAGNOSIS — J449 Chronic obstructive pulmonary disease, unspecified: Secondary | ICD-10-CM | POA: Diagnosis not present

## 2014-04-17 DIAGNOSIS — E780 Pure hypercholesterolemia: Secondary | ICD-10-CM | POA: Diagnosis not present

## 2014-04-30 DIAGNOSIS — J45901 Unspecified asthma with (acute) exacerbation: Secondary | ICD-10-CM | POA: Diagnosis not present

## 2014-04-30 DIAGNOSIS — M150 Primary generalized (osteo)arthritis: Secondary | ICD-10-CM | POA: Diagnosis not present

## 2014-04-30 DIAGNOSIS — I4891 Unspecified atrial fibrillation: Secondary | ICD-10-CM | POA: Diagnosis not present

## 2014-04-30 DIAGNOSIS — R32 Unspecified urinary incontinence: Secondary | ICD-10-CM | POA: Diagnosis not present

## 2014-05-03 DIAGNOSIS — M25551 Pain in right hip: Secondary | ICD-10-CM | POA: Diagnosis not present

## 2014-05-03 DIAGNOSIS — Z87891 Personal history of nicotine dependence: Secondary | ICD-10-CM | POA: Diagnosis not present

## 2014-05-03 DIAGNOSIS — I509 Heart failure, unspecified: Secondary | ICD-10-CM | POA: Diagnosis not present

## 2014-05-03 DIAGNOSIS — Z961 Presence of intraocular lens: Secondary | ICD-10-CM | POA: Diagnosis not present

## 2014-05-03 DIAGNOSIS — Z8739 Personal history of other diseases of the musculoskeletal system and connective tissue: Secondary | ICD-10-CM | POA: Diagnosis not present

## 2014-05-03 DIAGNOSIS — I10 Essential (primary) hypertension: Secondary | ICD-10-CM | POA: Diagnosis not present

## 2014-05-03 DIAGNOSIS — G8929 Other chronic pain: Secondary | ICD-10-CM | POA: Diagnosis not present

## 2014-05-03 DIAGNOSIS — Z79899 Other long term (current) drug therapy: Secondary | ICD-10-CM | POA: Diagnosis not present

## 2014-05-03 DIAGNOSIS — E780 Pure hypercholesterolemia: Secondary | ICD-10-CM | POA: Diagnosis not present

## 2014-05-03 DIAGNOSIS — Z90710 Acquired absence of both cervix and uterus: Secondary | ICD-10-CM | POA: Diagnosis not present

## 2014-05-03 DIAGNOSIS — R05 Cough: Secondary | ICD-10-CM | POA: Diagnosis not present

## 2014-05-03 DIAGNOSIS — J40 Bronchitis, not specified as acute or chronic: Secondary | ICD-10-CM | POA: Diagnosis not present

## 2014-05-03 DIAGNOSIS — J45909 Unspecified asthma, uncomplicated: Secondary | ICD-10-CM | POA: Diagnosis not present

## 2014-05-03 DIAGNOSIS — R0989 Other specified symptoms and signs involving the circulatory and respiratory systems: Secondary | ICD-10-CM | POA: Diagnosis not present

## 2014-05-03 DIAGNOSIS — Z9889 Other specified postprocedural states: Secondary | ICD-10-CM | POA: Diagnosis not present

## 2014-05-03 DIAGNOSIS — J449 Chronic obstructive pulmonary disease, unspecified: Secondary | ICD-10-CM | POA: Diagnosis not present

## 2014-05-03 DIAGNOSIS — K219 Gastro-esophageal reflux disease without esophagitis: Secondary | ICD-10-CM | POA: Diagnosis not present

## 2014-05-03 DIAGNOSIS — Z880 Allergy status to penicillin: Secondary | ICD-10-CM | POA: Diagnosis not present

## 2014-05-14 ENCOUNTER — Encounter (HOSPITAL_COMMUNITY): Payer: Self-pay | Admitting: Emergency Medicine

## 2014-05-14 DIAGNOSIS — Z961 Presence of intraocular lens: Secondary | ICD-10-CM | POA: Diagnosis not present

## 2014-05-14 DIAGNOSIS — H5202 Hypermetropia, left eye: Secondary | ICD-10-CM | POA: Diagnosis not present

## 2014-05-14 DIAGNOSIS — H52223 Regular astigmatism, bilateral: Secondary | ICD-10-CM | POA: Diagnosis not present

## 2014-05-14 DIAGNOSIS — H1859 Other hereditary corneal dystrophies: Secondary | ICD-10-CM | POA: Diagnosis not present

## 2014-06-18 DIAGNOSIS — Z0000 Encounter for general adult medical examination without abnormal findings: Secondary | ICD-10-CM | POA: Diagnosis not present

## 2014-06-18 DIAGNOSIS — J4520 Mild intermittent asthma, uncomplicated: Secondary | ICD-10-CM | POA: Diagnosis not present

## 2014-06-18 DIAGNOSIS — I10 Essential (primary) hypertension: Secondary | ICD-10-CM | POA: Diagnosis not present

## 2014-06-18 DIAGNOSIS — Z1389 Encounter for screening for other disorder: Secondary | ICD-10-CM | POA: Diagnosis not present

## 2014-06-29 DIAGNOSIS — R32 Unspecified urinary incontinence: Secondary | ICD-10-CM | POA: Diagnosis not present

## 2014-06-29 DIAGNOSIS — J45901 Unspecified asthma with (acute) exacerbation: Secondary | ICD-10-CM | POA: Diagnosis not present

## 2014-06-29 DIAGNOSIS — I4891 Unspecified atrial fibrillation: Secondary | ICD-10-CM | POA: Diagnosis not present

## 2014-06-29 DIAGNOSIS — M150 Primary generalized (osteo)arthritis: Secondary | ICD-10-CM | POA: Diagnosis not present

## 2014-08-09 DIAGNOSIS — Z6841 Body Mass Index (BMI) 40.0 and over, adult: Secondary | ICD-10-CM | POA: Diagnosis not present

## 2014-08-09 DIAGNOSIS — Z01419 Encounter for gynecological examination (general) (routine) without abnormal findings: Secondary | ICD-10-CM | POA: Diagnosis not present

## 2014-08-28 DIAGNOSIS — M150 Primary generalized (osteo)arthritis: Secondary | ICD-10-CM | POA: Diagnosis not present

## 2014-08-28 DIAGNOSIS — R32 Unspecified urinary incontinence: Secondary | ICD-10-CM | POA: Diagnosis not present

## 2014-08-28 DIAGNOSIS — J45901 Unspecified asthma with (acute) exacerbation: Secondary | ICD-10-CM | POA: Diagnosis not present

## 2014-08-28 DIAGNOSIS — I4891 Unspecified atrial fibrillation: Secondary | ICD-10-CM | POA: Diagnosis not present

## 2014-09-17 DIAGNOSIS — I10 Essential (primary) hypertension: Secondary | ICD-10-CM | POA: Diagnosis not present

## 2014-09-17 DIAGNOSIS — Z131 Encounter for screening for diabetes mellitus: Secondary | ICD-10-CM | POA: Diagnosis not present

## 2014-09-17 DIAGNOSIS — L2089 Other atopic dermatitis: Secondary | ICD-10-CM | POA: Diagnosis not present

## 2014-09-17 DIAGNOSIS — J45998 Other asthma: Secondary | ICD-10-CM | POA: Diagnosis not present

## 2014-10-13 DIAGNOSIS — R0600 Dyspnea, unspecified: Secondary | ICD-10-CM | POA: Diagnosis not present

## 2014-10-13 DIAGNOSIS — J4541 Moderate persistent asthma with (acute) exacerbation: Secondary | ICD-10-CM | POA: Diagnosis not present

## 2014-10-13 DIAGNOSIS — R0602 Shortness of breath: Secondary | ICD-10-CM | POA: Diagnosis not present

## 2014-10-13 DIAGNOSIS — E6609 Other obesity due to excess calories: Secondary | ICD-10-CM | POA: Diagnosis not present

## 2014-10-13 DIAGNOSIS — J45901 Unspecified asthma with (acute) exacerbation: Secondary | ICD-10-CM | POA: Diagnosis not present

## 2014-10-13 DIAGNOSIS — I10 Essential (primary) hypertension: Secondary | ICD-10-CM | POA: Diagnosis not present

## 2014-10-13 DIAGNOSIS — K449 Diaphragmatic hernia without obstruction or gangrene: Secondary | ICD-10-CM | POA: Diagnosis not present

## 2014-10-13 DIAGNOSIS — R0789 Other chest pain: Secondary | ICD-10-CM | POA: Diagnosis not present

## 2014-10-13 DIAGNOSIS — Z6841 Body Mass Index (BMI) 40.0 and over, adult: Secondary | ICD-10-CM | POA: Diagnosis not present

## 2014-10-13 DIAGNOSIS — I517 Cardiomegaly: Secondary | ICD-10-CM | POA: Diagnosis not present

## 2014-10-13 DIAGNOSIS — M109 Gout, unspecified: Secondary | ICD-10-CM | POA: Diagnosis not present

## 2014-10-14 DIAGNOSIS — R0600 Dyspnea, unspecified: Secondary | ICD-10-CM | POA: Diagnosis not present

## 2014-10-14 DIAGNOSIS — I517 Cardiomegaly: Secondary | ICD-10-CM | POA: Diagnosis not present

## 2014-10-15 DIAGNOSIS — Z825 Family history of asthma and other chronic lower respiratory diseases: Secondary | ICD-10-CM | POA: Diagnosis not present

## 2014-10-15 DIAGNOSIS — E780 Pure hypercholesterolemia: Secondary | ICD-10-CM | POA: Diagnosis present

## 2014-10-15 DIAGNOSIS — K449 Diaphragmatic hernia without obstruction or gangrene: Secondary | ICD-10-CM | POA: Diagnosis present

## 2014-10-15 DIAGNOSIS — Z7952 Long term (current) use of systemic steroids: Secondary | ICD-10-CM | POA: Diagnosis not present

## 2014-10-15 DIAGNOSIS — Z7951 Long term (current) use of inhaled steroids: Secondary | ICD-10-CM | POA: Diagnosis not present

## 2014-10-15 DIAGNOSIS — J4541 Moderate persistent asthma with (acute) exacerbation: Secondary | ICD-10-CM | POA: Diagnosis present

## 2014-10-15 DIAGNOSIS — J449 Chronic obstructive pulmonary disease, unspecified: Secondary | ICD-10-CM | POA: Diagnosis not present

## 2014-10-15 DIAGNOSIS — I10 Essential (primary) hypertension: Secondary | ICD-10-CM | POA: Diagnosis not present

## 2014-10-15 DIAGNOSIS — J45901 Unspecified asthma with (acute) exacerbation: Secondary | ICD-10-CM | POA: Diagnosis not present

## 2014-10-15 DIAGNOSIS — E785 Hyperlipidemia, unspecified: Secondary | ICD-10-CM | POA: Diagnosis present

## 2014-10-15 DIAGNOSIS — Z6841 Body Mass Index (BMI) 40.0 and over, adult: Secondary | ICD-10-CM | POA: Diagnosis not present

## 2014-10-15 DIAGNOSIS — Z780 Asymptomatic menopausal state: Secondary | ICD-10-CM | POA: Diagnosis not present

## 2014-10-15 DIAGNOSIS — Z79899 Other long term (current) drug therapy: Secondary | ICD-10-CM | POA: Diagnosis not present

## 2014-10-15 DIAGNOSIS — K219 Gastro-esophageal reflux disease without esophagitis: Secondary | ICD-10-CM | POA: Diagnosis present

## 2014-10-15 DIAGNOSIS — I509 Heart failure, unspecified: Secondary | ICD-10-CM | POA: Diagnosis present

## 2014-10-15 DIAGNOSIS — I878 Other specified disorders of veins: Secondary | ICD-10-CM | POA: Diagnosis present

## 2014-10-15 DIAGNOSIS — R0902 Hypoxemia: Secondary | ICD-10-CM | POA: Diagnosis present

## 2014-10-15 DIAGNOSIS — E6609 Other obesity due to excess calories: Secondary | ICD-10-CM | POA: Diagnosis present

## 2014-10-15 DIAGNOSIS — M109 Gout, unspecified: Secondary | ICD-10-CM | POA: Diagnosis not present

## 2014-10-15 DIAGNOSIS — J4521 Mild intermittent asthma with (acute) exacerbation: Secondary | ICD-10-CM | POA: Diagnosis not present

## 2014-10-15 DIAGNOSIS — Z880 Allergy status to penicillin: Secondary | ICD-10-CM | POA: Diagnosis not present

## 2014-10-15 DIAGNOSIS — Z8249 Family history of ischemic heart disease and other diseases of the circulatory system: Secondary | ICD-10-CM | POA: Diagnosis not present

## 2014-10-24 DIAGNOSIS — J4541 Moderate persistent asthma with (acute) exacerbation: Secondary | ICD-10-CM | POA: Diagnosis not present

## 2014-10-24 DIAGNOSIS — R32 Unspecified urinary incontinence: Secondary | ICD-10-CM | POA: Diagnosis not present

## 2014-10-26 DIAGNOSIS — E785 Hyperlipidemia, unspecified: Secondary | ICD-10-CM | POA: Diagnosis not present

## 2014-10-26 DIAGNOSIS — I10 Essential (primary) hypertension: Secondary | ICD-10-CM | POA: Diagnosis not present

## 2014-10-26 DIAGNOSIS — J441 Chronic obstructive pulmonary disease with (acute) exacerbation: Secondary | ICD-10-CM | POA: Diagnosis not present

## 2014-10-26 DIAGNOSIS — M109 Gout, unspecified: Secondary | ICD-10-CM | POA: Diagnosis not present

## 2014-10-26 DIAGNOSIS — M545 Low back pain: Secondary | ICD-10-CM | POA: Diagnosis not present

## 2014-10-26 DIAGNOSIS — J45901 Unspecified asthma with (acute) exacerbation: Secondary | ICD-10-CM | POA: Diagnosis not present

## 2014-10-30 DIAGNOSIS — J45901 Unspecified asthma with (acute) exacerbation: Secondary | ICD-10-CM | POA: Diagnosis not present

## 2014-10-30 DIAGNOSIS — M109 Gout, unspecified: Secondary | ICD-10-CM | POA: Diagnosis not present

## 2014-10-30 DIAGNOSIS — I10 Essential (primary) hypertension: Secondary | ICD-10-CM | POA: Diagnosis not present

## 2014-10-30 DIAGNOSIS — J441 Chronic obstructive pulmonary disease with (acute) exacerbation: Secondary | ICD-10-CM | POA: Diagnosis not present

## 2014-10-30 DIAGNOSIS — E785 Hyperlipidemia, unspecified: Secondary | ICD-10-CM | POA: Diagnosis not present

## 2014-10-30 DIAGNOSIS — M545 Low back pain: Secondary | ICD-10-CM | POA: Diagnosis not present

## 2014-10-31 DIAGNOSIS — M109 Gout, unspecified: Secondary | ICD-10-CM | POA: Diagnosis not present

## 2014-10-31 DIAGNOSIS — J45901 Unspecified asthma with (acute) exacerbation: Secondary | ICD-10-CM | POA: Diagnosis not present

## 2014-10-31 DIAGNOSIS — J441 Chronic obstructive pulmonary disease with (acute) exacerbation: Secondary | ICD-10-CM | POA: Diagnosis not present

## 2014-10-31 DIAGNOSIS — I10 Essential (primary) hypertension: Secondary | ICD-10-CM | POA: Diagnosis not present

## 2014-10-31 DIAGNOSIS — E785 Hyperlipidemia, unspecified: Secondary | ICD-10-CM | POA: Diagnosis not present

## 2014-10-31 DIAGNOSIS — M545 Low back pain: Secondary | ICD-10-CM | POA: Diagnosis not present

## 2014-11-01 DIAGNOSIS — I10 Essential (primary) hypertension: Secondary | ICD-10-CM | POA: Diagnosis not present

## 2014-11-01 DIAGNOSIS — J441 Chronic obstructive pulmonary disease with (acute) exacerbation: Secondary | ICD-10-CM | POA: Diagnosis not present

## 2014-11-01 DIAGNOSIS — E785 Hyperlipidemia, unspecified: Secondary | ICD-10-CM | POA: Diagnosis not present

## 2014-11-01 DIAGNOSIS — M545 Low back pain: Secondary | ICD-10-CM | POA: Diagnosis not present

## 2014-11-01 DIAGNOSIS — J45901 Unspecified asthma with (acute) exacerbation: Secondary | ICD-10-CM | POA: Diagnosis not present

## 2014-11-01 DIAGNOSIS — M109 Gout, unspecified: Secondary | ICD-10-CM | POA: Diagnosis not present

## 2014-11-06 DIAGNOSIS — Z1231 Encounter for screening mammogram for malignant neoplasm of breast: Secondary | ICD-10-CM | POA: Diagnosis not present

## 2014-11-06 DIAGNOSIS — M545 Low back pain: Secondary | ICD-10-CM | POA: Diagnosis not present

## 2014-11-06 DIAGNOSIS — R928 Other abnormal and inconclusive findings on diagnostic imaging of breast: Secondary | ICD-10-CM | POA: Diagnosis not present

## 2014-11-06 DIAGNOSIS — E785 Hyperlipidemia, unspecified: Secondary | ICD-10-CM | POA: Diagnosis not present

## 2014-11-06 DIAGNOSIS — M109 Gout, unspecified: Secondary | ICD-10-CM | POA: Diagnosis not present

## 2014-11-06 DIAGNOSIS — J441 Chronic obstructive pulmonary disease with (acute) exacerbation: Secondary | ICD-10-CM | POA: Diagnosis not present

## 2014-11-06 DIAGNOSIS — I10 Essential (primary) hypertension: Secondary | ICD-10-CM | POA: Diagnosis not present

## 2014-11-06 DIAGNOSIS — J45901 Unspecified asthma with (acute) exacerbation: Secondary | ICD-10-CM | POA: Diagnosis not present

## 2014-11-09 DIAGNOSIS — J441 Chronic obstructive pulmonary disease with (acute) exacerbation: Secondary | ICD-10-CM | POA: Diagnosis not present

## 2014-11-09 DIAGNOSIS — E785 Hyperlipidemia, unspecified: Secondary | ICD-10-CM | POA: Diagnosis not present

## 2014-11-09 DIAGNOSIS — J45901 Unspecified asthma with (acute) exacerbation: Secondary | ICD-10-CM | POA: Diagnosis not present

## 2014-11-09 DIAGNOSIS — I10 Essential (primary) hypertension: Secondary | ICD-10-CM | POA: Diagnosis not present

## 2014-11-09 DIAGNOSIS — M545 Low back pain: Secondary | ICD-10-CM | POA: Diagnosis not present

## 2014-11-09 DIAGNOSIS — M109 Gout, unspecified: Secondary | ICD-10-CM | POA: Diagnosis not present

## 2014-11-12 IMAGING — CT CT ANKLE*L* W/CM
3 series · 16 of 35 positions shown, 19 images · IV contrast (omnipaque)
Comparison: None.

CLINICAL DATA: Posterior lower ankle wound for few days.

EXAM:
CT OF THE  ANKLE WITH CONTRAST
TECHNIQUE: Multidetector CT imaging of the ankle was performed following the
standard protocol during bolus administration of intravenous
contrast.
CONTRAST:  80mL OMNIPAQUE IOHEXOL 300 MG/ML  SOLN

[Series 3: extremity soft tissue 3.0 b30s · axial · 0.40mm/px · z∈[-202,-54]mm · 8 of 59 slices shown, 10 images]
[im 5/59  soft-tissue]
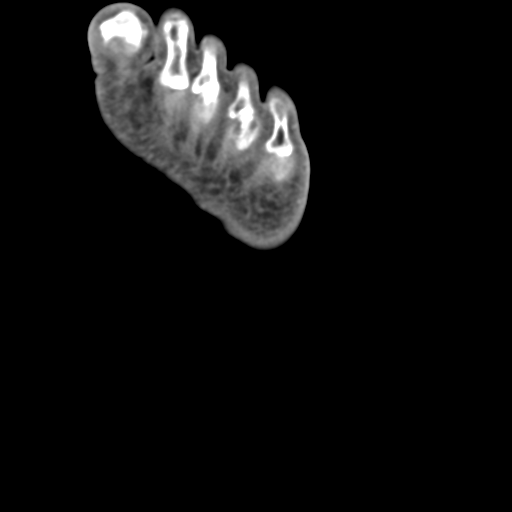
[im 5/59  bone]
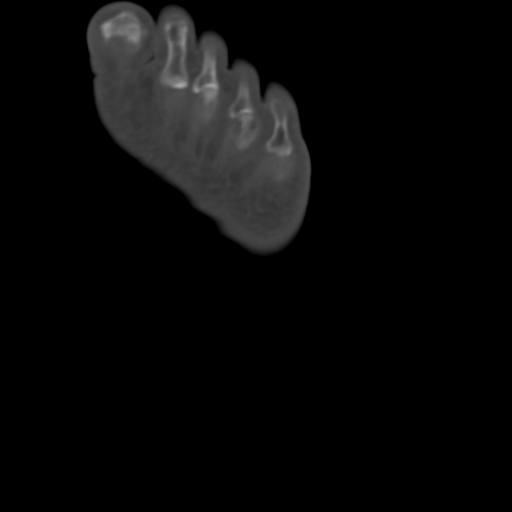
[im 14/59  bone]
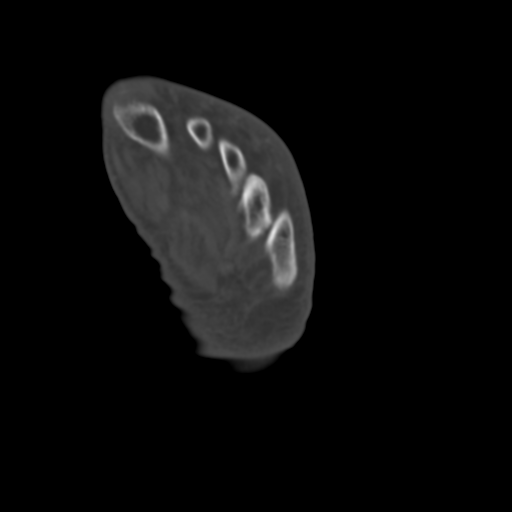
[im 18/59  bone]
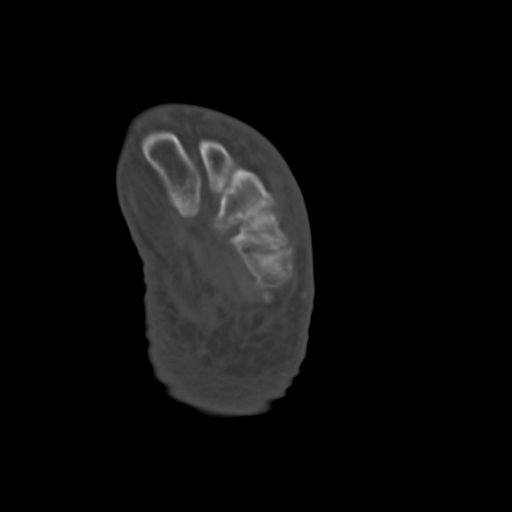
[im 27/59  bone]
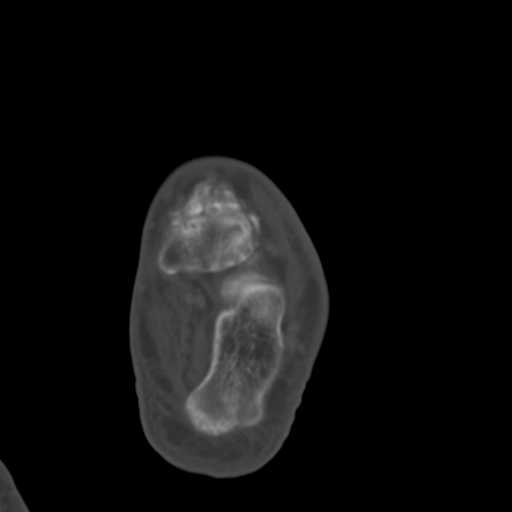
[im 32/59  soft-tissue]
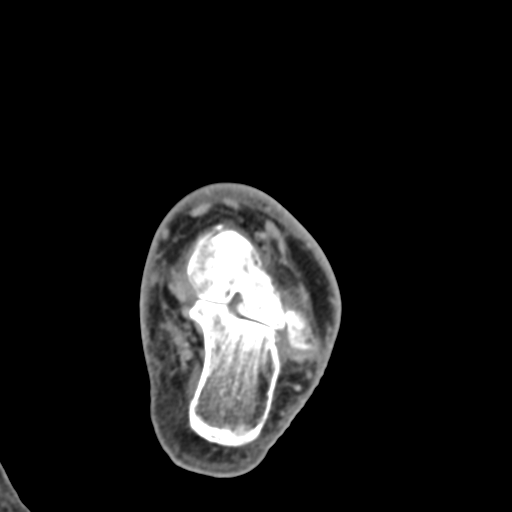
[im 32/59  bone]
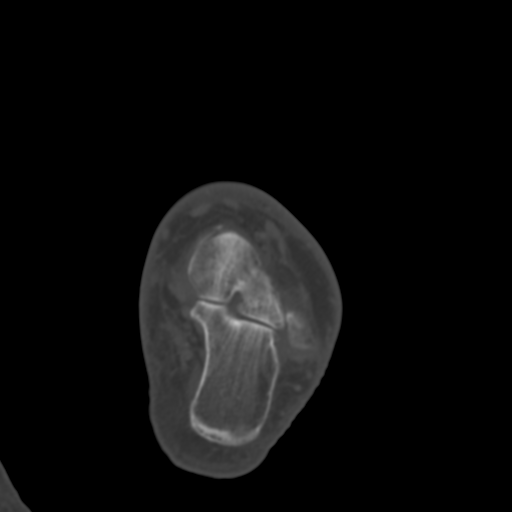
[im 41/59  bone]
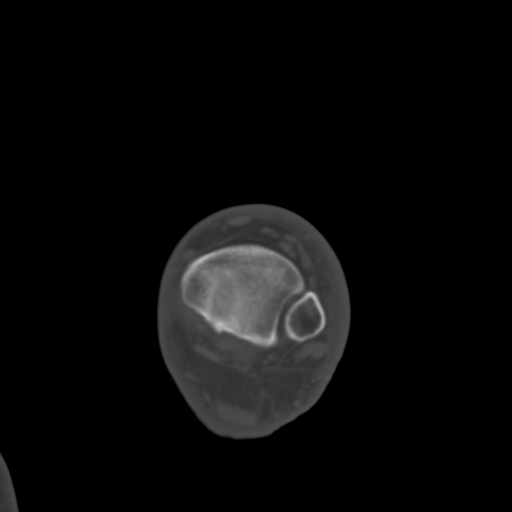
[im 45/59  bone]
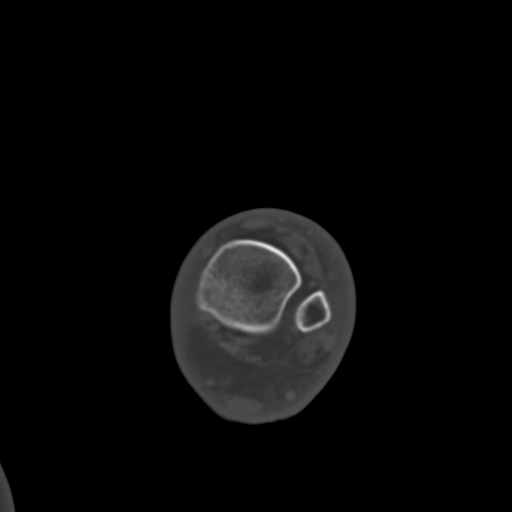
[im 54/59  bone]
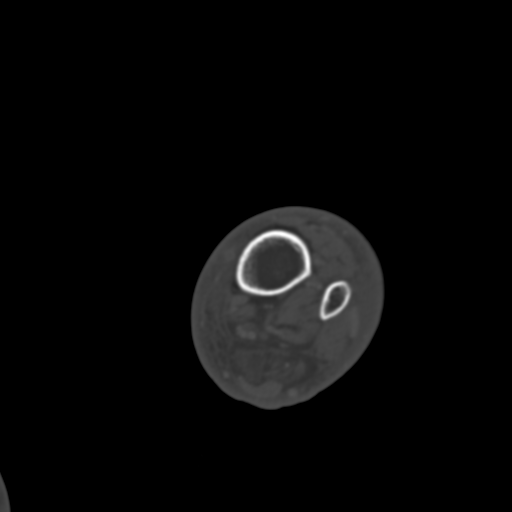

[Series 8: extremity 3.0 bone · coronal · 0.36mm/px · 3 of 51 slices shown (1 of 2)]
[im 12/51  bone]
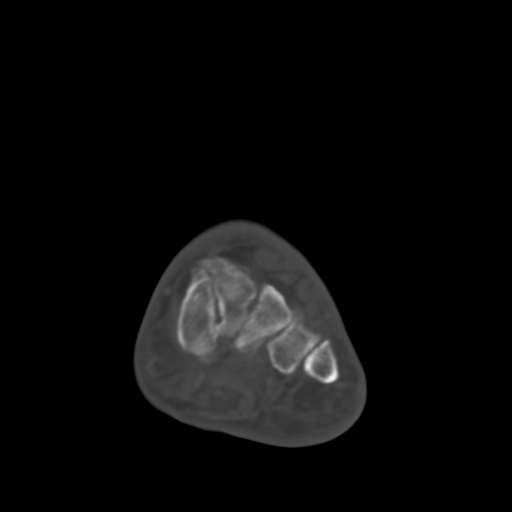
[im 21/51  bone]
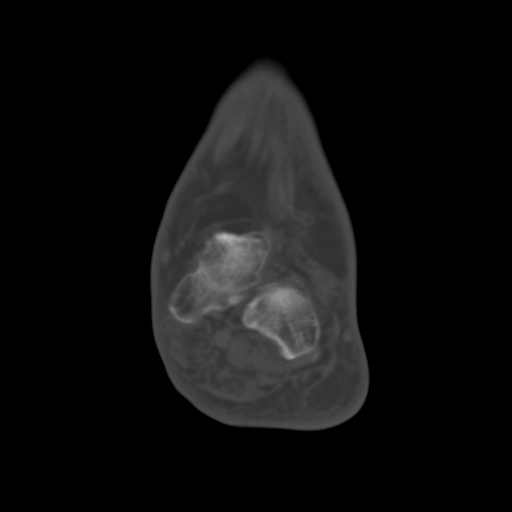
[im 30/51  bone]
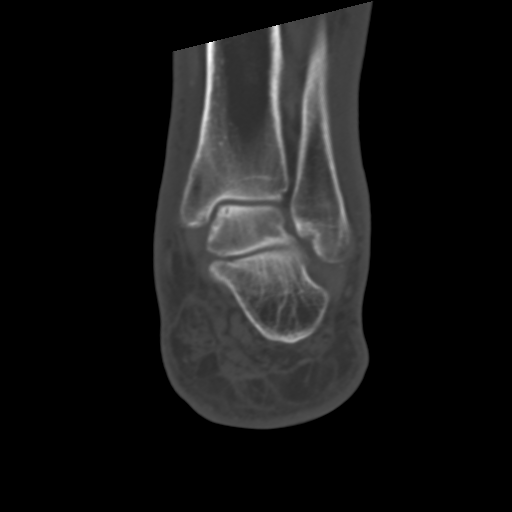

[Series 9: extremity 3.0 bone · sagittal · 0.36mm/px · 5 of 31 slices shown, 6 images (2 of 2)]
[im 11/31  bone]
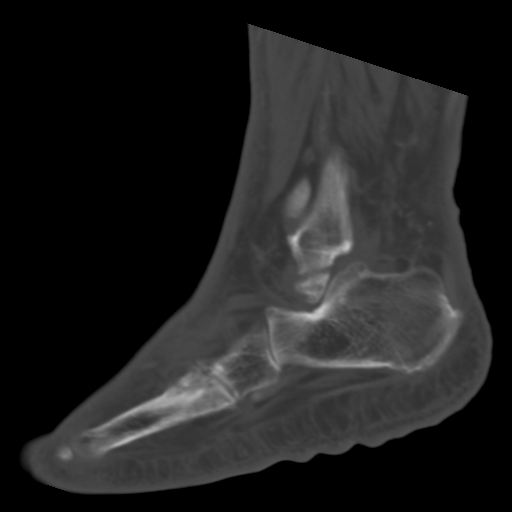
[im 13/31  bone]
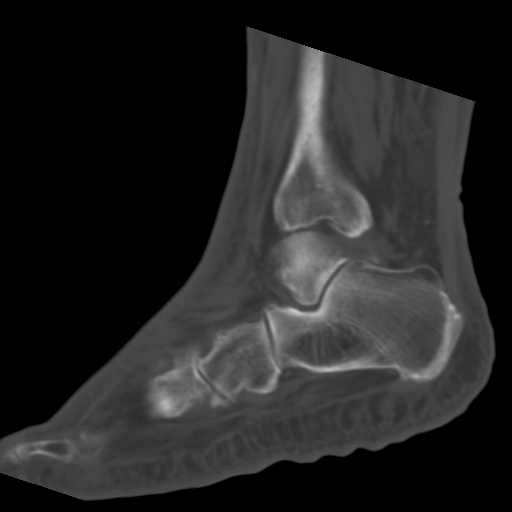
[im 16/31  soft-tissue]
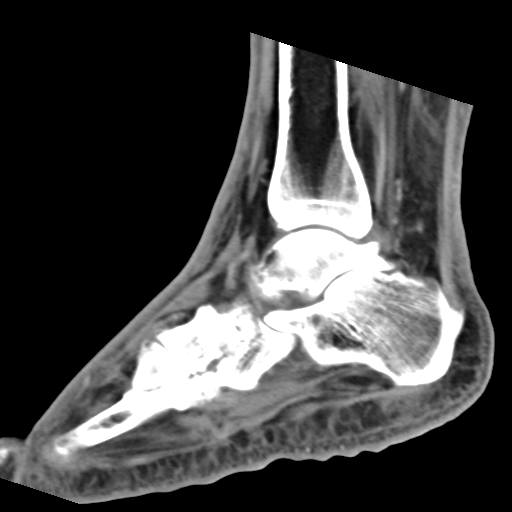
[im 16/31  bone]
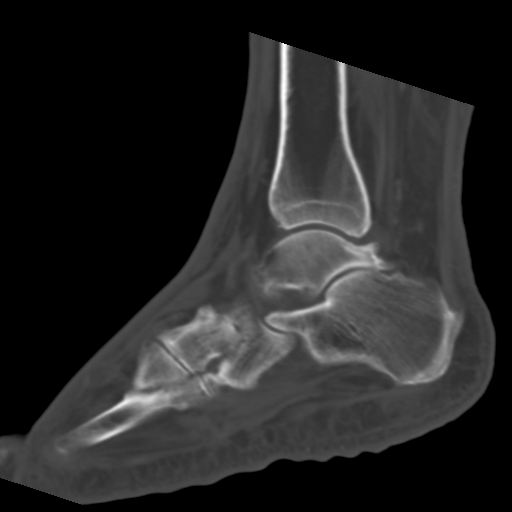
[im 18/31  bone]
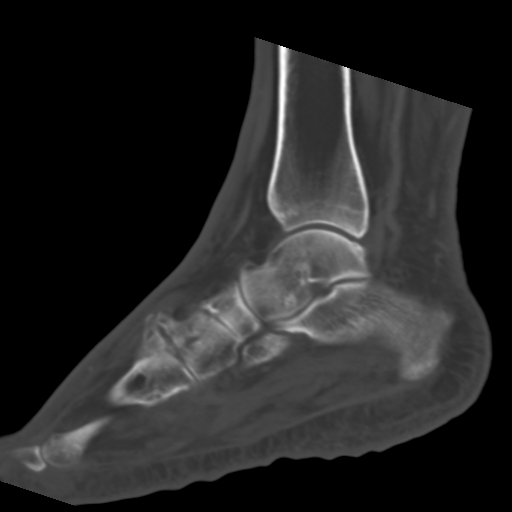
[im 21/31  bone]
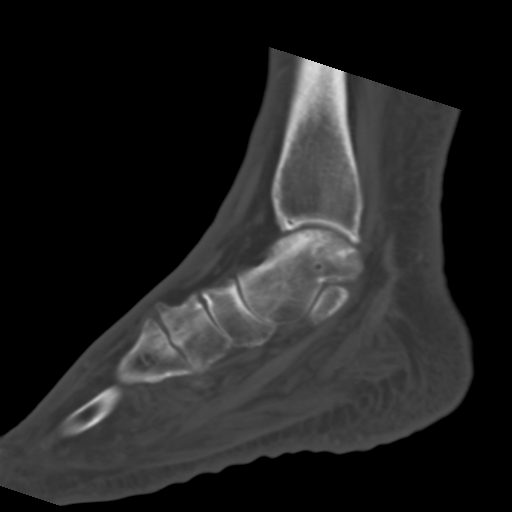

[16 of 35 positions shown; findings below may reference images not displayed]

FINDINGS: There is osteopenia. There are degenerative joint changes of the
tarsal bones with osteophyte formation and subchondral cysts. There
is no acute fracture or dislocation. There is no evidence of bone
destruction to suggest osteomyelitis. There is minimal plantar
calcaneal spur There is generalized mild soft tissue swelling around
the ankle. There is no focal fluid collection.
IMPRESSION: No definite evidence of abscess. Mild generalized soft tissue
swelling of the ankle. Degenerative joint changes of the tarsal
bones. There is no bone destruction to suggest osteomyelitis.

## 2014-11-16 DIAGNOSIS — M109 Gout, unspecified: Secondary | ICD-10-CM | POA: Diagnosis not present

## 2014-11-16 DIAGNOSIS — I10 Essential (primary) hypertension: Secondary | ICD-10-CM | POA: Diagnosis not present

## 2014-11-16 DIAGNOSIS — E785 Hyperlipidemia, unspecified: Secondary | ICD-10-CM | POA: Diagnosis not present

## 2014-11-16 DIAGNOSIS — M545 Low back pain: Secondary | ICD-10-CM | POA: Diagnosis not present

## 2014-11-16 DIAGNOSIS — J45901 Unspecified asthma with (acute) exacerbation: Secondary | ICD-10-CM | POA: Diagnosis not present

## 2014-11-16 DIAGNOSIS — J441 Chronic obstructive pulmonary disease with (acute) exacerbation: Secondary | ICD-10-CM | POA: Diagnosis not present

## 2014-11-21 DIAGNOSIS — E785 Hyperlipidemia, unspecified: Secondary | ICD-10-CM | POA: Diagnosis not present

## 2014-11-21 DIAGNOSIS — J441 Chronic obstructive pulmonary disease with (acute) exacerbation: Secondary | ICD-10-CM | POA: Diagnosis not present

## 2014-11-21 DIAGNOSIS — M109 Gout, unspecified: Secondary | ICD-10-CM | POA: Diagnosis not present

## 2014-11-21 DIAGNOSIS — J45901 Unspecified asthma with (acute) exacerbation: Secondary | ICD-10-CM | POA: Diagnosis not present

## 2014-11-21 DIAGNOSIS — I10 Essential (primary) hypertension: Secondary | ICD-10-CM | POA: Diagnosis not present

## 2014-11-21 DIAGNOSIS — M545 Low back pain: Secondary | ICD-10-CM | POA: Diagnosis not present

## 2014-11-26 DIAGNOSIS — M545 Low back pain: Secondary | ICD-10-CM | POA: Diagnosis not present

## 2014-11-26 DIAGNOSIS — J45901 Unspecified asthma with (acute) exacerbation: Secondary | ICD-10-CM | POA: Diagnosis not present

## 2014-11-26 DIAGNOSIS — M109 Gout, unspecified: Secondary | ICD-10-CM | POA: Diagnosis not present

## 2014-11-26 DIAGNOSIS — I10 Essential (primary) hypertension: Secondary | ICD-10-CM | POA: Diagnosis not present

## 2014-11-26 DIAGNOSIS — J441 Chronic obstructive pulmonary disease with (acute) exacerbation: Secondary | ICD-10-CM | POA: Diagnosis not present

## 2014-11-26 DIAGNOSIS — E785 Hyperlipidemia, unspecified: Secondary | ICD-10-CM | POA: Diagnosis not present

## 2014-12-03 DIAGNOSIS — I10 Essential (primary) hypertension: Secondary | ICD-10-CM | POA: Diagnosis not present

## 2014-12-03 DIAGNOSIS — M109 Gout, unspecified: Secondary | ICD-10-CM | POA: Diagnosis not present

## 2014-12-03 DIAGNOSIS — J441 Chronic obstructive pulmonary disease with (acute) exacerbation: Secondary | ICD-10-CM | POA: Diagnosis not present

## 2014-12-03 DIAGNOSIS — J45901 Unspecified asthma with (acute) exacerbation: Secondary | ICD-10-CM | POA: Diagnosis not present

## 2014-12-03 DIAGNOSIS — M545 Low back pain: Secondary | ICD-10-CM | POA: Diagnosis not present

## 2014-12-03 DIAGNOSIS — E785 Hyperlipidemia, unspecified: Secondary | ICD-10-CM | POA: Diagnosis not present

## 2014-12-11 DIAGNOSIS — J45901 Unspecified asthma with (acute) exacerbation: Secondary | ICD-10-CM | POA: Diagnosis not present

## 2014-12-11 DIAGNOSIS — I10 Essential (primary) hypertension: Secondary | ICD-10-CM | POA: Diagnosis not present

## 2014-12-11 DIAGNOSIS — J441 Chronic obstructive pulmonary disease with (acute) exacerbation: Secondary | ICD-10-CM | POA: Diagnosis not present

## 2014-12-11 DIAGNOSIS — E785 Hyperlipidemia, unspecified: Secondary | ICD-10-CM | POA: Diagnosis not present

## 2014-12-11 DIAGNOSIS — M109 Gout, unspecified: Secondary | ICD-10-CM | POA: Diagnosis not present

## 2014-12-11 DIAGNOSIS — M545 Low back pain: Secondary | ICD-10-CM | POA: Diagnosis not present

## 2014-12-20 DIAGNOSIS — M109 Gout, unspecified: Secondary | ICD-10-CM | POA: Diagnosis not present

## 2014-12-20 DIAGNOSIS — M545 Low back pain: Secondary | ICD-10-CM | POA: Diagnosis not present

## 2014-12-20 DIAGNOSIS — J441 Chronic obstructive pulmonary disease with (acute) exacerbation: Secondary | ICD-10-CM | POA: Diagnosis not present

## 2014-12-20 DIAGNOSIS — J45901 Unspecified asthma with (acute) exacerbation: Secondary | ICD-10-CM | POA: Diagnosis not present

## 2014-12-20 DIAGNOSIS — I10 Essential (primary) hypertension: Secondary | ICD-10-CM | POA: Diagnosis not present

## 2014-12-20 DIAGNOSIS — E785 Hyperlipidemia, unspecified: Secondary | ICD-10-CM | POA: Diagnosis not present

## 2014-12-21 DIAGNOSIS — N3941 Urge incontinence: Secondary | ICD-10-CM | POA: Diagnosis not present

## 2014-12-21 DIAGNOSIS — J4541 Moderate persistent asthma with (acute) exacerbation: Secondary | ICD-10-CM | POA: Diagnosis not present

## 2014-12-25 DIAGNOSIS — J45901 Unspecified asthma with (acute) exacerbation: Secondary | ICD-10-CM | POA: Diagnosis not present

## 2014-12-25 DIAGNOSIS — I10 Essential (primary) hypertension: Secondary | ICD-10-CM | POA: Diagnosis not present

## 2014-12-25 DIAGNOSIS — M545 Low back pain: Secondary | ICD-10-CM | POA: Diagnosis not present

## 2014-12-25 DIAGNOSIS — J441 Chronic obstructive pulmonary disease with (acute) exacerbation: Secondary | ICD-10-CM | POA: Diagnosis not present

## 2015-01-10 DIAGNOSIS — L97312 Non-pressure chronic ulcer of right ankle with fat layer exposed: Secondary | ICD-10-CM | POA: Diagnosis not present

## 2015-01-10 DIAGNOSIS — I83213 Varicose veins of right lower extremity with both ulcer of ankle and inflammation: Secondary | ICD-10-CM | POA: Diagnosis not present

## 2015-01-10 DIAGNOSIS — I872 Venous insufficiency (chronic) (peripheral): Secondary | ICD-10-CM | POA: Diagnosis not present

## 2015-01-10 DIAGNOSIS — L97319 Non-pressure chronic ulcer of right ankle with unspecified severity: Secondary | ICD-10-CM | POA: Diagnosis not present

## 2015-01-18 DIAGNOSIS — I87011 Postthrombotic syndrome with ulcer of right lower extremity: Secondary | ICD-10-CM | POA: Diagnosis not present

## 2015-01-18 DIAGNOSIS — L97313 Non-pressure chronic ulcer of right ankle with necrosis of muscle: Secondary | ICD-10-CM | POA: Diagnosis not present

## 2015-01-25 DIAGNOSIS — L97313 Non-pressure chronic ulcer of right ankle with necrosis of muscle: Secondary | ICD-10-CM | POA: Diagnosis not present

## 2015-01-25 DIAGNOSIS — I87011 Postthrombotic syndrome with ulcer of right lower extremity: Secondary | ICD-10-CM | POA: Diagnosis not present

## 2015-01-31 DIAGNOSIS — I87011 Postthrombotic syndrome with ulcer of right lower extremity: Secondary | ICD-10-CM | POA: Diagnosis not present

## 2015-01-31 DIAGNOSIS — L97313 Non-pressure chronic ulcer of right ankle with necrosis of muscle: Secondary | ICD-10-CM | POA: Diagnosis not present

## 2015-02-05 DIAGNOSIS — I87011 Postthrombotic syndrome with ulcer of right lower extremity: Secondary | ICD-10-CM | POA: Diagnosis not present

## 2015-02-05 DIAGNOSIS — L97313 Non-pressure chronic ulcer of right ankle with necrosis of muscle: Secondary | ICD-10-CM | POA: Diagnosis not present

## 2015-02-12 DIAGNOSIS — I872 Venous insufficiency (chronic) (peripheral): Secondary | ICD-10-CM | POA: Diagnosis not present

## 2015-02-12 DIAGNOSIS — L97313 Non-pressure chronic ulcer of right ankle with necrosis of muscle: Secondary | ICD-10-CM | POA: Diagnosis not present

## 2015-02-12 DIAGNOSIS — L97319 Non-pressure chronic ulcer of right ankle with unspecified severity: Secondary | ICD-10-CM | POA: Diagnosis not present

## 2015-02-19 DIAGNOSIS — L97319 Non-pressure chronic ulcer of right ankle with unspecified severity: Secondary | ICD-10-CM | POA: Diagnosis not present

## 2015-02-19 DIAGNOSIS — I83213 Varicose veins of right lower extremity with both ulcer of ankle and inflammation: Secondary | ICD-10-CM | POA: Diagnosis not present

## 2015-02-19 DIAGNOSIS — I872 Venous insufficiency (chronic) (peripheral): Secondary | ICD-10-CM | POA: Diagnosis not present

## 2015-02-19 DIAGNOSIS — L97313 Non-pressure chronic ulcer of right ankle with necrosis of muscle: Secondary | ICD-10-CM | POA: Diagnosis not present

## 2015-02-23 DIAGNOSIS — J45998 Other asthma: Secondary | ICD-10-CM | POA: Diagnosis not present

## 2015-02-23 DIAGNOSIS — I10 Essential (primary) hypertension: Secondary | ICD-10-CM | POA: Diagnosis not present

## 2015-02-23 DIAGNOSIS — M545 Low back pain: Secondary | ICD-10-CM | POA: Diagnosis not present

## 2015-02-23 DIAGNOSIS — J441 Chronic obstructive pulmonary disease with (acute) exacerbation: Secondary | ICD-10-CM | POA: Diagnosis not present

## 2015-02-26 DIAGNOSIS — I83213 Varicose veins of right lower extremity with both ulcer of ankle and inflammation: Secondary | ICD-10-CM | POA: Diagnosis not present

## 2015-02-26 DIAGNOSIS — L97313 Non-pressure chronic ulcer of right ankle with necrosis of muscle: Secondary | ICD-10-CM | POA: Diagnosis not present

## 2015-02-26 DIAGNOSIS — I872 Venous insufficiency (chronic) (peripheral): Secondary | ICD-10-CM | POA: Diagnosis not present

## 2015-02-26 DIAGNOSIS — L97319 Non-pressure chronic ulcer of right ankle with unspecified severity: Secondary | ICD-10-CM | POA: Diagnosis not present

## 2015-03-05 DIAGNOSIS — I872 Venous insufficiency (chronic) (peripheral): Secondary | ICD-10-CM | POA: Diagnosis not present

## 2015-03-05 DIAGNOSIS — L97319 Non-pressure chronic ulcer of right ankle with unspecified severity: Secondary | ICD-10-CM | POA: Diagnosis not present

## 2015-03-05 DIAGNOSIS — L97313 Non-pressure chronic ulcer of right ankle with necrosis of muscle: Secondary | ICD-10-CM | POA: Diagnosis not present

## 2015-03-05 DIAGNOSIS — I83213 Varicose veins of right lower extremity with both ulcer of ankle and inflammation: Secondary | ICD-10-CM | POA: Diagnosis not present

## 2015-03-07 DIAGNOSIS — J209 Acute bronchitis, unspecified: Secondary | ICD-10-CM | POA: Diagnosis not present

## 2015-03-07 DIAGNOSIS — I10 Essential (primary) hypertension: Secondary | ICD-10-CM | POA: Diagnosis not present

## 2015-03-12 DIAGNOSIS — I83213 Varicose veins of right lower extremity with both ulcer of ankle and inflammation: Secondary | ICD-10-CM | POA: Diagnosis not present

## 2015-03-12 DIAGNOSIS — L97319 Non-pressure chronic ulcer of right ankle with unspecified severity: Secondary | ICD-10-CM | POA: Diagnosis not present

## 2015-03-12 DIAGNOSIS — I872 Venous insufficiency (chronic) (peripheral): Secondary | ICD-10-CM | POA: Diagnosis not present

## 2015-03-12 DIAGNOSIS — L97313 Non-pressure chronic ulcer of right ankle with necrosis of muscle: Secondary | ICD-10-CM | POA: Diagnosis not present

## 2015-03-19 DIAGNOSIS — I83213 Varicose veins of right lower extremity with both ulcer of ankle and inflammation: Secondary | ICD-10-CM | POA: Diagnosis not present

## 2015-03-19 DIAGNOSIS — I872 Venous insufficiency (chronic) (peripheral): Secondary | ICD-10-CM | POA: Diagnosis not present

## 2015-03-19 DIAGNOSIS — L97313 Non-pressure chronic ulcer of right ankle with necrosis of muscle: Secondary | ICD-10-CM | POA: Diagnosis not present

## 2015-04-24 DIAGNOSIS — J45998 Other asthma: Secondary | ICD-10-CM | POA: Diagnosis not present

## 2015-04-24 DIAGNOSIS — J441 Chronic obstructive pulmonary disease with (acute) exacerbation: Secondary | ICD-10-CM | POA: Diagnosis not present

## 2015-04-24 DIAGNOSIS — M545 Low back pain: Secondary | ICD-10-CM | POA: Diagnosis not present

## 2015-04-24 DIAGNOSIS — I10 Essential (primary) hypertension: Secondary | ICD-10-CM | POA: Diagnosis not present

## 2015-07-19 DIAGNOSIS — J45909 Unspecified asthma, uncomplicated: Secondary | ICD-10-CM | POA: Diagnosis not present

## 2015-07-19 DIAGNOSIS — J441 Chronic obstructive pulmonary disease with (acute) exacerbation: Secondary | ICD-10-CM | POA: Diagnosis not present

## 2015-08-19 DIAGNOSIS — M109 Gout, unspecified: Secondary | ICD-10-CM | POA: Diagnosis not present

## 2015-08-19 DIAGNOSIS — J441 Chronic obstructive pulmonary disease with (acute) exacerbation: Secondary | ICD-10-CM | POA: Diagnosis not present

## 2015-08-19 DIAGNOSIS — Z9981 Dependence on supplemental oxygen: Secondary | ICD-10-CM | POA: Diagnosis not present

## 2015-08-19 DIAGNOSIS — J449 Chronic obstructive pulmonary disease, unspecified: Secondary | ICD-10-CM | POA: Diagnosis not present

## 2015-08-19 DIAGNOSIS — J45909 Unspecified asthma, uncomplicated: Secondary | ICD-10-CM | POA: Diagnosis not present

## 2015-08-19 DIAGNOSIS — I10 Essential (primary) hypertension: Secondary | ICD-10-CM | POA: Diagnosis not present

## 2015-08-19 DIAGNOSIS — E785 Hyperlipidemia, unspecified: Secondary | ICD-10-CM | POA: Diagnosis not present

## 2015-08-19 DIAGNOSIS — M545 Low back pain: Secondary | ICD-10-CM | POA: Diagnosis not present

## 2015-08-22 DIAGNOSIS — J45998 Other asthma: Secondary | ICD-10-CM | POA: Diagnosis not present

## 2015-08-22 DIAGNOSIS — M545 Low back pain: Secondary | ICD-10-CM | POA: Diagnosis not present

## 2015-08-22 DIAGNOSIS — I10 Essential (primary) hypertension: Secondary | ICD-10-CM | POA: Diagnosis not present

## 2015-08-22 DIAGNOSIS — J441 Chronic obstructive pulmonary disease with (acute) exacerbation: Secondary | ICD-10-CM | POA: Diagnosis not present

## 2015-09-10 DIAGNOSIS — J3089 Other allergic rhinitis: Secondary | ICD-10-CM | POA: Diagnosis not present

## 2015-09-10 DIAGNOSIS — Z0000 Encounter for general adult medical examination without abnormal findings: Secondary | ICD-10-CM | POA: Diagnosis not present

## 2015-09-10 DIAGNOSIS — Z6839 Body mass index (BMI) 39.0-39.9, adult: Secondary | ICD-10-CM | POA: Diagnosis not present

## 2015-09-10 DIAGNOSIS — G4733 Obstructive sleep apnea (adult) (pediatric): Secondary | ICD-10-CM | POA: Diagnosis not present

## 2015-09-10 DIAGNOSIS — I10 Essential (primary) hypertension: Secondary | ICD-10-CM | POA: Diagnosis not present

## 2015-09-10 DIAGNOSIS — J440 Chronic obstructive pulmonary disease with acute lower respiratory infection: Secondary | ICD-10-CM | POA: Diagnosis not present

## 2015-09-16 DIAGNOSIS — J45909 Unspecified asthma, uncomplicated: Secondary | ICD-10-CM | POA: Diagnosis not present

## 2015-09-16 DIAGNOSIS — J441 Chronic obstructive pulmonary disease with (acute) exacerbation: Secondary | ICD-10-CM | POA: Diagnosis not present

## 2015-10-17 DIAGNOSIS — J45909 Unspecified asthma, uncomplicated: Secondary | ICD-10-CM | POA: Diagnosis not present

## 2015-10-17 DIAGNOSIS — J441 Chronic obstructive pulmonary disease with (acute) exacerbation: Secondary | ICD-10-CM | POA: Diagnosis not present

## 2015-10-23 DIAGNOSIS — E1143 Type 2 diabetes mellitus with diabetic autonomic (poly)neuropathy: Secondary | ICD-10-CM | POA: Diagnosis not present

## 2015-10-23 DIAGNOSIS — J440 Chronic obstructive pulmonary disease with acute lower respiratory infection: Secondary | ICD-10-CM | POA: Diagnosis not present

## 2015-10-23 DIAGNOSIS — I10 Essential (primary) hypertension: Secondary | ICD-10-CM | POA: Diagnosis not present

## 2015-11-16 DIAGNOSIS — J45909 Unspecified asthma, uncomplicated: Secondary | ICD-10-CM | POA: Diagnosis not present

## 2015-11-16 DIAGNOSIS — J441 Chronic obstructive pulmonary disease with (acute) exacerbation: Secondary | ICD-10-CM | POA: Diagnosis not present

## 2015-12-16 DIAGNOSIS — J45909 Unspecified asthma, uncomplicated: Secondary | ICD-10-CM | POA: Diagnosis not present

## 2015-12-16 DIAGNOSIS — Z794 Long term (current) use of insulin: Secondary | ICD-10-CM | POA: Diagnosis not present

## 2015-12-16 DIAGNOSIS — E785 Hyperlipidemia, unspecified: Secondary | ICD-10-CM | POA: Diagnosis not present

## 2015-12-16 DIAGNOSIS — I10 Essential (primary) hypertension: Secondary | ICD-10-CM | POA: Diagnosis not present

## 2015-12-16 DIAGNOSIS — E119 Type 2 diabetes mellitus without complications: Secondary | ICD-10-CM | POA: Diagnosis not present

## 2015-12-16 DIAGNOSIS — J449 Chronic obstructive pulmonary disease, unspecified: Secondary | ICD-10-CM | POA: Diagnosis not present

## 2015-12-16 DIAGNOSIS — Z9981 Dependence on supplemental oxygen: Secondary | ICD-10-CM | POA: Diagnosis not present

## 2015-12-16 DIAGNOSIS — M109 Gout, unspecified: Secondary | ICD-10-CM | POA: Diagnosis not present

## 2015-12-16 DIAGNOSIS — M545 Low back pain: Secondary | ICD-10-CM | POA: Diagnosis not present

## 2015-12-17 DIAGNOSIS — J441 Chronic obstructive pulmonary disease with (acute) exacerbation: Secondary | ICD-10-CM | POA: Diagnosis not present

## 2015-12-17 DIAGNOSIS — J45909 Unspecified asthma, uncomplicated: Secondary | ICD-10-CM | POA: Diagnosis not present

## 2015-12-20 DIAGNOSIS — M545 Low back pain: Secondary | ICD-10-CM | POA: Diagnosis not present

## 2015-12-20 DIAGNOSIS — J45998 Other asthma: Secondary | ICD-10-CM | POA: Diagnosis not present

## 2015-12-20 DIAGNOSIS — J441 Chronic obstructive pulmonary disease with (acute) exacerbation: Secondary | ICD-10-CM | POA: Diagnosis not present

## 2015-12-20 DIAGNOSIS — I10 Essential (primary) hypertension: Secondary | ICD-10-CM | POA: Diagnosis not present

## 2015-12-30 DIAGNOSIS — Z1389 Encounter for screening for other disorder: Secondary | ICD-10-CM | POA: Diagnosis not present

## 2015-12-30 DIAGNOSIS — I10 Essential (primary) hypertension: Secondary | ICD-10-CM | POA: Diagnosis not present

## 2015-12-30 DIAGNOSIS — E1165 Type 2 diabetes mellitus with hyperglycemia: Secondary | ICD-10-CM | POA: Diagnosis not present

## 2015-12-30 DIAGNOSIS — Z0000 Encounter for general adult medical examination without abnormal findings: Secondary | ICD-10-CM | POA: Diagnosis not present

## 2016-01-16 DIAGNOSIS — J45909 Unspecified asthma, uncomplicated: Secondary | ICD-10-CM | POA: Diagnosis not present

## 2016-01-16 DIAGNOSIS — J441 Chronic obstructive pulmonary disease with (acute) exacerbation: Secondary | ICD-10-CM | POA: Diagnosis not present

## 2016-01-20 DIAGNOSIS — K219 Gastro-esophageal reflux disease without esophagitis: Secondary | ICD-10-CM | POA: Diagnosis not present

## 2016-01-20 DIAGNOSIS — E7800 Pure hypercholesterolemia, unspecified: Secondary | ICD-10-CM | POA: Diagnosis not present

## 2016-01-20 DIAGNOSIS — J449 Chronic obstructive pulmonary disease, unspecified: Secondary | ICD-10-CM | POA: Diagnosis not present

## 2016-01-20 DIAGNOSIS — W01198A Fall on same level from slipping, tripping and stumbling with subsequent striking against other object, initial encounter: Secondary | ICD-10-CM | POA: Diagnosis not present

## 2016-01-20 DIAGNOSIS — Z79899 Other long term (current) drug therapy: Secondary | ICD-10-CM | POA: Diagnosis not present

## 2016-01-20 DIAGNOSIS — I110 Hypertensive heart disease with heart failure: Secondary | ICD-10-CM | POA: Diagnosis not present

## 2016-01-20 DIAGNOSIS — I509 Heart failure, unspecified: Secondary | ICD-10-CM | POA: Diagnosis not present

## 2016-01-20 DIAGNOSIS — S00501A Unspecified superficial injury of lip, initial encounter: Secondary | ICD-10-CM | POA: Diagnosis not present

## 2016-02-13 DIAGNOSIS — Z794 Long term (current) use of insulin: Secondary | ICD-10-CM | POA: Diagnosis not present

## 2016-02-13 DIAGNOSIS — J449 Chronic obstructive pulmonary disease, unspecified: Secondary | ICD-10-CM | POA: Diagnosis not present

## 2016-02-13 DIAGNOSIS — E119 Type 2 diabetes mellitus without complications: Secondary | ICD-10-CM | POA: Diagnosis not present

## 2016-02-13 DIAGNOSIS — M545 Low back pain: Secondary | ICD-10-CM | POA: Diagnosis not present

## 2016-02-13 DIAGNOSIS — E785 Hyperlipidemia, unspecified: Secondary | ICD-10-CM | POA: Diagnosis not present

## 2016-02-13 DIAGNOSIS — M109 Gout, unspecified: Secondary | ICD-10-CM | POA: Diagnosis not present

## 2016-02-13 DIAGNOSIS — J45909 Unspecified asthma, uncomplicated: Secondary | ICD-10-CM | POA: Diagnosis not present

## 2016-02-13 DIAGNOSIS — Z9981 Dependence on supplemental oxygen: Secondary | ICD-10-CM | POA: Diagnosis not present

## 2016-02-13 DIAGNOSIS — I10 Essential (primary) hypertension: Secondary | ICD-10-CM | POA: Diagnosis not present

## 2016-02-16 DIAGNOSIS — J441 Chronic obstructive pulmonary disease with (acute) exacerbation: Secondary | ICD-10-CM | POA: Diagnosis not present

## 2016-02-16 DIAGNOSIS — J45909 Unspecified asthma, uncomplicated: Secondary | ICD-10-CM | POA: Diagnosis not present

## 2016-02-18 DIAGNOSIS — M545 Low back pain: Secondary | ICD-10-CM | POA: Diagnosis not present

## 2016-02-18 DIAGNOSIS — J45998 Other asthma: Secondary | ICD-10-CM | POA: Diagnosis not present

## 2016-02-18 DIAGNOSIS — J441 Chronic obstructive pulmonary disease with (acute) exacerbation: Secondary | ICD-10-CM | POA: Diagnosis not present

## 2016-02-18 DIAGNOSIS — I10 Essential (primary) hypertension: Secondary | ICD-10-CM | POA: Diagnosis not present

## 2016-03-18 DIAGNOSIS — J45909 Unspecified asthma, uncomplicated: Secondary | ICD-10-CM | POA: Diagnosis not present

## 2016-03-18 DIAGNOSIS — J441 Chronic obstructive pulmonary disease with (acute) exacerbation: Secondary | ICD-10-CM | POA: Diagnosis not present

## 2016-03-26 DIAGNOSIS — J441 Chronic obstructive pulmonary disease with (acute) exacerbation: Secondary | ICD-10-CM | POA: Diagnosis not present

## 2016-03-26 DIAGNOSIS — J45909 Unspecified asthma, uncomplicated: Secondary | ICD-10-CM | POA: Diagnosis not present

## 2016-04-09 DIAGNOSIS — M1711 Unilateral primary osteoarthritis, right knee: Secondary | ICD-10-CM | POA: Diagnosis not present

## 2016-04-09 DIAGNOSIS — I10 Essential (primary) hypertension: Secondary | ICD-10-CM | POA: Diagnosis not present

## 2016-04-09 DIAGNOSIS — E1165 Type 2 diabetes mellitus with hyperglycemia: Secondary | ICD-10-CM | POA: Diagnosis not present

## 2016-04-09 DIAGNOSIS — J4541 Moderate persistent asthma with (acute) exacerbation: Secondary | ICD-10-CM | POA: Diagnosis not present

## 2016-04-18 DIAGNOSIS — J45998 Other asthma: Secondary | ICD-10-CM | POA: Diagnosis not present

## 2016-04-18 DIAGNOSIS — J441 Chronic obstructive pulmonary disease with (acute) exacerbation: Secondary | ICD-10-CM | POA: Diagnosis not present

## 2016-04-18 DIAGNOSIS — I10 Essential (primary) hypertension: Secondary | ICD-10-CM | POA: Diagnosis not present

## 2016-04-18 DIAGNOSIS — M545 Low back pain: Secondary | ICD-10-CM | POA: Diagnosis not present

## 2016-04-22 DIAGNOSIS — M109 Gout, unspecified: Secondary | ICD-10-CM | POA: Diagnosis not present

## 2016-04-22 DIAGNOSIS — J449 Chronic obstructive pulmonary disease, unspecified: Secondary | ICD-10-CM | POA: Diagnosis not present

## 2016-04-25 DIAGNOSIS — J441 Chronic obstructive pulmonary disease with (acute) exacerbation: Secondary | ICD-10-CM | POA: Diagnosis not present

## 2016-04-25 DIAGNOSIS — J45909 Unspecified asthma, uncomplicated: Secondary | ICD-10-CM | POA: Diagnosis not present

## 2016-05-26 DIAGNOSIS — J441 Chronic obstructive pulmonary disease with (acute) exacerbation: Secondary | ICD-10-CM | POA: Diagnosis not present

## 2016-05-26 DIAGNOSIS — J45909 Unspecified asthma, uncomplicated: Secondary | ICD-10-CM | POA: Diagnosis not present

## 2016-06-15 DIAGNOSIS — J449 Chronic obstructive pulmonary disease, unspecified: Secondary | ICD-10-CM | POA: Diagnosis not present

## 2016-06-17 DIAGNOSIS — M545 Low back pain: Secondary | ICD-10-CM | POA: Diagnosis not present

## 2016-06-17 DIAGNOSIS — J45998 Other asthma: Secondary | ICD-10-CM | POA: Diagnosis not present

## 2016-06-17 DIAGNOSIS — I10 Essential (primary) hypertension: Secondary | ICD-10-CM | POA: Diagnosis not present

## 2016-06-17 DIAGNOSIS — J441 Chronic obstructive pulmonary disease with (acute) exacerbation: Secondary | ICD-10-CM | POA: Diagnosis not present

## 2016-06-25 DIAGNOSIS — J45909 Unspecified asthma, uncomplicated: Secondary | ICD-10-CM | POA: Diagnosis not present

## 2016-06-25 DIAGNOSIS — J441 Chronic obstructive pulmonary disease with (acute) exacerbation: Secondary | ICD-10-CM | POA: Diagnosis not present

## 2016-07-26 DIAGNOSIS — J45909 Unspecified asthma, uncomplicated: Secondary | ICD-10-CM | POA: Diagnosis not present

## 2016-07-26 DIAGNOSIS — J441 Chronic obstructive pulmonary disease with (acute) exacerbation: Secondary | ICD-10-CM | POA: Diagnosis not present

## 2016-08-13 DIAGNOSIS — E1165 Type 2 diabetes mellitus with hyperglycemia: Secondary | ICD-10-CM | POA: Diagnosis not present

## 2016-08-13 DIAGNOSIS — Z0000 Encounter for general adult medical examination without abnormal findings: Secondary | ICD-10-CM | POA: Diagnosis not present

## 2016-08-13 DIAGNOSIS — M1711 Unilateral primary osteoarthritis, right knee: Secondary | ICD-10-CM | POA: Diagnosis not present

## 2016-08-13 DIAGNOSIS — I10 Essential (primary) hypertension: Secondary | ICD-10-CM | POA: Diagnosis not present

## 2016-08-13 DIAGNOSIS — J4541 Moderate persistent asthma with (acute) exacerbation: Secondary | ICD-10-CM | POA: Diagnosis not present

## 2016-08-16 DIAGNOSIS — J45998 Other asthma: Secondary | ICD-10-CM | POA: Diagnosis not present

## 2016-08-16 DIAGNOSIS — E119 Type 2 diabetes mellitus without complications: Secondary | ICD-10-CM | POA: Diagnosis not present

## 2016-08-16 DIAGNOSIS — I10 Essential (primary) hypertension: Secondary | ICD-10-CM | POA: Diagnosis not present

## 2016-08-16 DIAGNOSIS — J441 Chronic obstructive pulmonary disease with (acute) exacerbation: Secondary | ICD-10-CM | POA: Diagnosis not present

## 2016-08-26 DIAGNOSIS — J45909 Unspecified asthma, uncomplicated: Secondary | ICD-10-CM | POA: Diagnosis not present

## 2016-08-26 DIAGNOSIS — J441 Chronic obstructive pulmonary disease with (acute) exacerbation: Secondary | ICD-10-CM | POA: Diagnosis not present

## 2016-09-23 DIAGNOSIS — J441 Chronic obstructive pulmonary disease with (acute) exacerbation: Secondary | ICD-10-CM | POA: Diagnosis not present

## 2016-09-23 DIAGNOSIS — J45909 Unspecified asthma, uncomplicated: Secondary | ICD-10-CM | POA: Diagnosis not present

## 2016-10-08 DIAGNOSIS — M25561 Pain in right knee: Secondary | ICD-10-CM | POA: Diagnosis not present

## 2016-10-08 DIAGNOSIS — M170 Bilateral primary osteoarthritis of knee: Secondary | ICD-10-CM | POA: Diagnosis not present

## 2016-10-08 DIAGNOSIS — M25562 Pain in left knee: Secondary | ICD-10-CM | POA: Diagnosis not present

## 2016-10-12 DIAGNOSIS — J45909 Unspecified asthma, uncomplicated: Secondary | ICD-10-CM | POA: Diagnosis not present

## 2016-10-12 DIAGNOSIS — Z9981 Dependence on supplemental oxygen: Secondary | ICD-10-CM | POA: Diagnosis not present

## 2016-10-12 DIAGNOSIS — M545 Low back pain: Secondary | ICD-10-CM | POA: Diagnosis not present

## 2016-10-12 DIAGNOSIS — J449 Chronic obstructive pulmonary disease, unspecified: Secondary | ICD-10-CM | POA: Diagnosis not present

## 2016-10-12 DIAGNOSIS — Z7951 Long term (current) use of inhaled steroids: Secondary | ICD-10-CM | POA: Diagnosis not present

## 2016-10-12 DIAGNOSIS — E119 Type 2 diabetes mellitus without complications: Secondary | ICD-10-CM | POA: Diagnosis not present

## 2016-10-12 DIAGNOSIS — Z794 Long term (current) use of insulin: Secondary | ICD-10-CM | POA: Diagnosis not present

## 2016-10-12 DIAGNOSIS — I10 Essential (primary) hypertension: Secondary | ICD-10-CM | POA: Diagnosis not present

## 2016-10-12 DIAGNOSIS — Z791 Long term (current) use of non-steroidal anti-inflammatories (NSAID): Secondary | ICD-10-CM | POA: Diagnosis not present

## 2016-10-12 DIAGNOSIS — E785 Hyperlipidemia, unspecified: Secondary | ICD-10-CM | POA: Diagnosis not present

## 2016-10-15 DIAGNOSIS — J45998 Other asthma: Secondary | ICD-10-CM | POA: Diagnosis not present

## 2016-10-15 DIAGNOSIS — E119 Type 2 diabetes mellitus without complications: Secondary | ICD-10-CM | POA: Diagnosis not present

## 2016-10-15 DIAGNOSIS — I10 Essential (primary) hypertension: Secondary | ICD-10-CM | POA: Diagnosis not present

## 2016-10-15 DIAGNOSIS — J441 Chronic obstructive pulmonary disease with (acute) exacerbation: Secondary | ICD-10-CM | POA: Diagnosis not present

## 2016-10-24 DIAGNOSIS — J45909 Unspecified asthma, uncomplicated: Secondary | ICD-10-CM | POA: Diagnosis not present

## 2016-10-24 DIAGNOSIS — J441 Chronic obstructive pulmonary disease with (acute) exacerbation: Secondary | ICD-10-CM | POA: Diagnosis not present

## 2016-11-23 DIAGNOSIS — J45909 Unspecified asthma, uncomplicated: Secondary | ICD-10-CM | POA: Diagnosis not present

## 2016-11-23 DIAGNOSIS — J441 Chronic obstructive pulmonary disease with (acute) exacerbation: Secondary | ICD-10-CM | POA: Diagnosis not present

## 2016-11-24 DIAGNOSIS — I10 Essential (primary) hypertension: Secondary | ICD-10-CM | POA: Diagnosis not present

## 2016-11-24 DIAGNOSIS — E119 Type 2 diabetes mellitus without complications: Secondary | ICD-10-CM | POA: Diagnosis not present

## 2016-11-24 DIAGNOSIS — J4541 Moderate persistent asthma with (acute) exacerbation: Secondary | ICD-10-CM | POA: Diagnosis not present

## 2016-11-24 DIAGNOSIS — M1711 Unilateral primary osteoarthritis, right knee: Secondary | ICD-10-CM | POA: Diagnosis not present

## 2016-12-10 DIAGNOSIS — Z9981 Dependence on supplemental oxygen: Secondary | ICD-10-CM | POA: Diagnosis not present

## 2016-12-10 DIAGNOSIS — E119 Type 2 diabetes mellitus without complications: Secondary | ICD-10-CM | POA: Diagnosis not present

## 2016-12-10 DIAGNOSIS — M545 Low back pain: Secondary | ICD-10-CM | POA: Diagnosis not present

## 2016-12-10 DIAGNOSIS — Z791 Long term (current) use of non-steroidal anti-inflammatories (NSAID): Secondary | ICD-10-CM | POA: Diagnosis not present

## 2016-12-10 DIAGNOSIS — J45909 Unspecified asthma, uncomplicated: Secondary | ICD-10-CM | POA: Diagnosis not present

## 2016-12-10 DIAGNOSIS — Z794 Long term (current) use of insulin: Secondary | ICD-10-CM | POA: Diagnosis not present

## 2016-12-10 DIAGNOSIS — I10 Essential (primary) hypertension: Secondary | ICD-10-CM | POA: Diagnosis not present

## 2016-12-10 DIAGNOSIS — J449 Chronic obstructive pulmonary disease, unspecified: Secondary | ICD-10-CM | POA: Diagnosis not present

## 2016-12-10 DIAGNOSIS — E785 Hyperlipidemia, unspecified: Secondary | ICD-10-CM | POA: Diagnosis not present

## 2016-12-10 DIAGNOSIS — Z7951 Long term (current) use of inhaled steroids: Secondary | ICD-10-CM | POA: Diagnosis not present

## 2016-12-14 DIAGNOSIS — E119 Type 2 diabetes mellitus without complications: Secondary | ICD-10-CM | POA: Diagnosis not present

## 2016-12-24 DIAGNOSIS — J45909 Unspecified asthma, uncomplicated: Secondary | ICD-10-CM | POA: Diagnosis not present

## 2016-12-24 DIAGNOSIS — J441 Chronic obstructive pulmonary disease with (acute) exacerbation: Secondary | ICD-10-CM | POA: Diagnosis not present

## 2017-01-23 DIAGNOSIS — J441 Chronic obstructive pulmonary disease with (acute) exacerbation: Secondary | ICD-10-CM | POA: Diagnosis not present

## 2017-01-23 DIAGNOSIS — J45909 Unspecified asthma, uncomplicated: Secondary | ICD-10-CM | POA: Diagnosis not present

## 2017-02-08 DIAGNOSIS — I10 Essential (primary) hypertension: Secondary | ICD-10-CM | POA: Diagnosis not present

## 2017-02-08 DIAGNOSIS — J449 Chronic obstructive pulmonary disease, unspecified: Secondary | ICD-10-CM | POA: Diagnosis not present

## 2017-02-08 DIAGNOSIS — M545 Low back pain: Secondary | ICD-10-CM | POA: Diagnosis not present

## 2017-02-08 DIAGNOSIS — Z7951 Long term (current) use of inhaled steroids: Secondary | ICD-10-CM | POA: Diagnosis not present

## 2017-02-08 DIAGNOSIS — E119 Type 2 diabetes mellitus without complications: Secondary | ICD-10-CM | POA: Diagnosis not present

## 2017-02-08 DIAGNOSIS — Z791 Long term (current) use of non-steroidal anti-inflammatories (NSAID): Secondary | ICD-10-CM | POA: Diagnosis not present

## 2017-02-08 DIAGNOSIS — Z794 Long term (current) use of insulin: Secondary | ICD-10-CM | POA: Diagnosis not present

## 2017-02-08 DIAGNOSIS — Z9981 Dependence on supplemental oxygen: Secondary | ICD-10-CM | POA: Diagnosis not present

## 2017-02-08 DIAGNOSIS — E785 Hyperlipidemia, unspecified: Secondary | ICD-10-CM | POA: Diagnosis not present

## 2017-02-08 DIAGNOSIS — J45909 Unspecified asthma, uncomplicated: Secondary | ICD-10-CM | POA: Diagnosis not present

## 2017-02-12 DIAGNOSIS — I10 Essential (primary) hypertension: Secondary | ICD-10-CM | POA: Diagnosis not present

## 2017-02-12 DIAGNOSIS — E119 Type 2 diabetes mellitus without complications: Secondary | ICD-10-CM | POA: Diagnosis not present

## 2017-02-12 DIAGNOSIS — J441 Chronic obstructive pulmonary disease with (acute) exacerbation: Secondary | ICD-10-CM | POA: Diagnosis not present

## 2017-02-12 DIAGNOSIS — J45998 Other asthma: Secondary | ICD-10-CM | POA: Diagnosis not present

## 2017-02-23 DIAGNOSIS — J441 Chronic obstructive pulmonary disease with (acute) exacerbation: Secondary | ICD-10-CM | POA: Diagnosis not present

## 2017-02-23 DIAGNOSIS — J45909 Unspecified asthma, uncomplicated: Secondary | ICD-10-CM | POA: Diagnosis not present

## 2017-03-06 DIAGNOSIS — I110 Hypertensive heart disease with heart failure: Secondary | ICD-10-CM | POA: Diagnosis not present

## 2017-03-06 DIAGNOSIS — E7800 Pure hypercholesterolemia, unspecified: Secondary | ICD-10-CM | POA: Diagnosis not present

## 2017-03-06 DIAGNOSIS — Z8249 Family history of ischemic heart disease and other diseases of the circulatory system: Secondary | ICD-10-CM | POA: Diagnosis not present

## 2017-03-06 DIAGNOSIS — Z79899 Other long term (current) drug therapy: Secondary | ICD-10-CM | POA: Diagnosis not present

## 2017-03-06 DIAGNOSIS — I509 Heart failure, unspecified: Secondary | ICD-10-CM | POA: Diagnosis not present

## 2017-03-06 DIAGNOSIS — Z7984 Long term (current) use of oral hypoglycemic drugs: Secondary | ICD-10-CM | POA: Diagnosis not present

## 2017-03-06 DIAGNOSIS — K219 Gastro-esophageal reflux disease without esophagitis: Secondary | ICD-10-CM | POA: Diagnosis not present

## 2017-03-06 DIAGNOSIS — R0602 Shortness of breath: Secondary | ICD-10-CM | POA: Diagnosis not present

## 2017-03-06 DIAGNOSIS — M549 Dorsalgia, unspecified: Secondary | ICD-10-CM | POA: Diagnosis not present

## 2017-03-06 DIAGNOSIS — R079 Chest pain, unspecified: Secondary | ICD-10-CM | POA: Diagnosis not present

## 2017-03-06 DIAGNOSIS — J449 Chronic obstructive pulmonary disease, unspecified: Secondary | ICD-10-CM | POA: Diagnosis not present

## 2017-03-09 DIAGNOSIS — J4541 Moderate persistent asthma with (acute) exacerbation: Secondary | ICD-10-CM | POA: Diagnosis not present

## 2017-03-09 DIAGNOSIS — E119 Type 2 diabetes mellitus without complications: Secondary | ICD-10-CM | POA: Diagnosis not present

## 2017-03-09 DIAGNOSIS — M10062 Idiopathic gout, left knee: Secondary | ICD-10-CM | POA: Diagnosis not present

## 2017-03-09 DIAGNOSIS — I10 Essential (primary) hypertension: Secondary | ICD-10-CM | POA: Diagnosis not present

## 2017-03-26 DIAGNOSIS — J441 Chronic obstructive pulmonary disease with (acute) exacerbation: Secondary | ICD-10-CM | POA: Diagnosis not present

## 2017-03-26 DIAGNOSIS — J45909 Unspecified asthma, uncomplicated: Secondary | ICD-10-CM | POA: Diagnosis not present

## 2017-04-13 DIAGNOSIS — J441 Chronic obstructive pulmonary disease with (acute) exacerbation: Secondary | ICD-10-CM | POA: Diagnosis not present

## 2017-04-13 DIAGNOSIS — E119 Type 2 diabetes mellitus without complications: Secondary | ICD-10-CM | POA: Diagnosis not present

## 2017-04-13 DIAGNOSIS — J4520 Mild intermittent asthma, uncomplicated: Secondary | ICD-10-CM | POA: Diagnosis not present

## 2017-04-13 DIAGNOSIS — I10 Essential (primary) hypertension: Secondary | ICD-10-CM | POA: Diagnosis not present

## 2017-04-25 DIAGNOSIS — J45909 Unspecified asthma, uncomplicated: Secondary | ICD-10-CM | POA: Diagnosis not present

## 2017-04-25 DIAGNOSIS — J441 Chronic obstructive pulmonary disease with (acute) exacerbation: Secondary | ICD-10-CM | POA: Diagnosis not present

## 2017-05-10 DIAGNOSIS — E119 Type 2 diabetes mellitus without complications: Secondary | ICD-10-CM | POA: Diagnosis not present

## 2017-05-10 DIAGNOSIS — H1851 Endothelial corneal dystrophy: Secondary | ICD-10-CM | POA: Diagnosis not present

## 2017-05-10 DIAGNOSIS — H26493 Other secondary cataract, bilateral: Secondary | ICD-10-CM | POA: Diagnosis not present

## 2017-05-10 DIAGNOSIS — H35373 Puckering of macula, bilateral: Secondary | ICD-10-CM | POA: Diagnosis not present

## 2017-05-26 DIAGNOSIS — J441 Chronic obstructive pulmonary disease with (acute) exacerbation: Secondary | ICD-10-CM | POA: Diagnosis not present

## 2017-05-26 DIAGNOSIS — J45909 Unspecified asthma, uncomplicated: Secondary | ICD-10-CM | POA: Diagnosis not present

## 2017-06-09 DIAGNOSIS — Z7951 Long term (current) use of inhaled steroids: Secondary | ICD-10-CM | POA: Diagnosis not present

## 2017-06-09 DIAGNOSIS — J449 Chronic obstructive pulmonary disease, unspecified: Secondary | ICD-10-CM | POA: Diagnosis not present

## 2017-06-09 DIAGNOSIS — Z794 Long term (current) use of insulin: Secondary | ICD-10-CM | POA: Diagnosis not present

## 2017-06-09 DIAGNOSIS — Z791 Long term (current) use of non-steroidal anti-inflammatories (NSAID): Secondary | ICD-10-CM | POA: Diagnosis not present

## 2017-06-09 DIAGNOSIS — J45909 Unspecified asthma, uncomplicated: Secondary | ICD-10-CM | POA: Diagnosis not present

## 2017-06-09 DIAGNOSIS — Z9981 Dependence on supplemental oxygen: Secondary | ICD-10-CM | POA: Diagnosis not present

## 2017-06-09 DIAGNOSIS — E785 Hyperlipidemia, unspecified: Secondary | ICD-10-CM | POA: Diagnosis not present

## 2017-06-09 DIAGNOSIS — I10 Essential (primary) hypertension: Secondary | ICD-10-CM | POA: Diagnosis not present

## 2017-06-09 DIAGNOSIS — E119 Type 2 diabetes mellitus without complications: Secondary | ICD-10-CM | POA: Diagnosis not present

## 2017-06-09 DIAGNOSIS — M545 Low back pain: Secondary | ICD-10-CM | POA: Diagnosis not present

## 2017-06-12 DIAGNOSIS — E119 Type 2 diabetes mellitus without complications: Secondary | ICD-10-CM | POA: Diagnosis not present

## 2017-06-12 DIAGNOSIS — J4541 Moderate persistent asthma with (acute) exacerbation: Secondary | ICD-10-CM | POA: Diagnosis not present

## 2017-06-12 DIAGNOSIS — I10 Essential (primary) hypertension: Secondary | ICD-10-CM | POA: Diagnosis not present

## 2017-06-12 DIAGNOSIS — J441 Chronic obstructive pulmonary disease with (acute) exacerbation: Secondary | ICD-10-CM | POA: Diagnosis not present

## 2017-06-25 DIAGNOSIS — J441 Chronic obstructive pulmonary disease with (acute) exacerbation: Secondary | ICD-10-CM | POA: Diagnosis not present

## 2017-06-25 DIAGNOSIS — J45909 Unspecified asthma, uncomplicated: Secondary | ICD-10-CM | POA: Diagnosis not present

## 2017-07-12 DIAGNOSIS — I10 Essential (primary) hypertension: Secondary | ICD-10-CM | POA: Diagnosis not present

## 2017-07-12 DIAGNOSIS — E119 Type 2 diabetes mellitus without complications: Secondary | ICD-10-CM | POA: Diagnosis not present

## 2017-07-12 DIAGNOSIS — J4541 Moderate persistent asthma with (acute) exacerbation: Secondary | ICD-10-CM | POA: Diagnosis not present

## 2017-07-26 DIAGNOSIS — J45909 Unspecified asthma, uncomplicated: Secondary | ICD-10-CM | POA: Diagnosis not present

## 2017-07-26 DIAGNOSIS — J441 Chronic obstructive pulmonary disease with (acute) exacerbation: Secondary | ICD-10-CM | POA: Diagnosis not present

## 2017-08-10 DIAGNOSIS — E785 Hyperlipidemia, unspecified: Secondary | ICD-10-CM | POA: Diagnosis not present

## 2017-08-10 DIAGNOSIS — Z9981 Dependence on supplemental oxygen: Secondary | ICD-10-CM | POA: Diagnosis not present

## 2017-08-10 DIAGNOSIS — E119 Type 2 diabetes mellitus without complications: Secondary | ICD-10-CM | POA: Diagnosis not present

## 2017-08-10 DIAGNOSIS — Z791 Long term (current) use of non-steroidal anti-inflammatories (NSAID): Secondary | ICD-10-CM | POA: Diagnosis not present

## 2017-08-10 DIAGNOSIS — J45909 Unspecified asthma, uncomplicated: Secondary | ICD-10-CM | POA: Diagnosis not present

## 2017-08-10 DIAGNOSIS — M545 Low back pain: Secondary | ICD-10-CM | POA: Diagnosis not present

## 2017-08-10 DIAGNOSIS — Z794 Long term (current) use of insulin: Secondary | ICD-10-CM | POA: Diagnosis not present

## 2017-08-10 DIAGNOSIS — J449 Chronic obstructive pulmonary disease, unspecified: Secondary | ICD-10-CM | POA: Diagnosis not present

## 2017-08-10 DIAGNOSIS — Z7951 Long term (current) use of inhaled steroids: Secondary | ICD-10-CM | POA: Diagnosis not present

## 2017-08-10 DIAGNOSIS — I10 Essential (primary) hypertension: Secondary | ICD-10-CM | POA: Diagnosis not present

## 2017-08-11 DIAGNOSIS — J441 Chronic obstructive pulmonary disease with (acute) exacerbation: Secondary | ICD-10-CM | POA: Diagnosis not present

## 2017-08-11 DIAGNOSIS — E119 Type 2 diabetes mellitus without complications: Secondary | ICD-10-CM | POA: Diagnosis not present

## 2017-08-11 DIAGNOSIS — I10 Essential (primary) hypertension: Secondary | ICD-10-CM | POA: Diagnosis not present

## 2017-08-11 DIAGNOSIS — J4541 Moderate persistent asthma with (acute) exacerbation: Secondary | ICD-10-CM | POA: Diagnosis not present

## 2017-08-26 DIAGNOSIS — J441 Chronic obstructive pulmonary disease with (acute) exacerbation: Secondary | ICD-10-CM | POA: Diagnosis not present

## 2017-08-26 DIAGNOSIS — J45909 Unspecified asthma, uncomplicated: Secondary | ICD-10-CM | POA: Diagnosis not present

## 2017-09-23 DIAGNOSIS — J45909 Unspecified asthma, uncomplicated: Secondary | ICD-10-CM | POA: Diagnosis not present

## 2017-09-23 DIAGNOSIS — J441 Chronic obstructive pulmonary disease with (acute) exacerbation: Secondary | ICD-10-CM | POA: Diagnosis not present

## 2017-10-06 DIAGNOSIS — Z9981 Dependence on supplemental oxygen: Secondary | ICD-10-CM | POA: Diagnosis not present

## 2017-10-06 DIAGNOSIS — Z791 Long term (current) use of non-steroidal anti-inflammatories (NSAID): Secondary | ICD-10-CM | POA: Diagnosis not present

## 2017-10-06 DIAGNOSIS — J449 Chronic obstructive pulmonary disease, unspecified: Secondary | ICD-10-CM | POA: Diagnosis not present

## 2017-10-06 DIAGNOSIS — E785 Hyperlipidemia, unspecified: Secondary | ICD-10-CM | POA: Diagnosis not present

## 2017-10-06 DIAGNOSIS — J45909 Unspecified asthma, uncomplicated: Secondary | ICD-10-CM | POA: Diagnosis not present

## 2017-10-06 DIAGNOSIS — Z7951 Long term (current) use of inhaled steroids: Secondary | ICD-10-CM | POA: Diagnosis not present

## 2017-10-06 DIAGNOSIS — I10 Essential (primary) hypertension: Secondary | ICD-10-CM | POA: Diagnosis not present

## 2017-10-06 DIAGNOSIS — Z794 Long term (current) use of insulin: Secondary | ICD-10-CM | POA: Diagnosis not present

## 2017-10-06 DIAGNOSIS — M545 Low back pain: Secondary | ICD-10-CM | POA: Diagnosis not present

## 2017-10-06 DIAGNOSIS — E119 Type 2 diabetes mellitus without complications: Secondary | ICD-10-CM | POA: Diagnosis not present

## 2017-10-10 DIAGNOSIS — I10 Essential (primary) hypertension: Secondary | ICD-10-CM | POA: Diagnosis not present

## 2017-10-10 DIAGNOSIS — E119 Type 2 diabetes mellitus without complications: Secondary | ICD-10-CM | POA: Diagnosis not present

## 2017-10-10 DIAGNOSIS — J4541 Moderate persistent asthma with (acute) exacerbation: Secondary | ICD-10-CM | POA: Diagnosis not present

## 2017-10-10 DIAGNOSIS — J441 Chronic obstructive pulmonary disease with (acute) exacerbation: Secondary | ICD-10-CM | POA: Diagnosis not present

## 2017-10-12 DIAGNOSIS — E119 Type 2 diabetes mellitus without complications: Secondary | ICD-10-CM | POA: Diagnosis not present

## 2017-10-12 DIAGNOSIS — J4541 Moderate persistent asthma with (acute) exacerbation: Secondary | ICD-10-CM | POA: Diagnosis not present

## 2017-10-12 DIAGNOSIS — Z1389 Encounter for screening for other disorder: Secondary | ICD-10-CM | POA: Diagnosis not present

## 2017-10-12 DIAGNOSIS — I10 Essential (primary) hypertension: Secondary | ICD-10-CM | POA: Diagnosis not present

## 2017-10-12 DIAGNOSIS — Z0000 Encounter for general adult medical examination without abnormal findings: Secondary | ICD-10-CM | POA: Diagnosis not present

## 2017-10-24 DIAGNOSIS — J45909 Unspecified asthma, uncomplicated: Secondary | ICD-10-CM | POA: Diagnosis not present

## 2017-10-24 DIAGNOSIS — J441 Chronic obstructive pulmonary disease with (acute) exacerbation: Secondary | ICD-10-CM | POA: Diagnosis not present

## 2017-11-04 DIAGNOSIS — I110 Hypertensive heart disease with heart failure: Secondary | ICD-10-CM | POA: Diagnosis not present

## 2017-11-04 DIAGNOSIS — M19012 Primary osteoarthritis, left shoulder: Secondary | ICD-10-CM | POA: Diagnosis not present

## 2017-11-04 DIAGNOSIS — R0789 Other chest pain: Secondary | ICD-10-CM | POA: Diagnosis not present

## 2017-11-04 DIAGNOSIS — M542 Cervicalgia: Secondary | ICD-10-CM | POA: Diagnosis not present

## 2017-11-04 DIAGNOSIS — E7800 Pure hypercholesterolemia, unspecified: Secondary | ICD-10-CM | POA: Diagnosis not present

## 2017-11-04 DIAGNOSIS — I509 Heart failure, unspecified: Secondary | ICD-10-CM | POA: Diagnosis not present

## 2017-11-04 DIAGNOSIS — S46912A Strain of unspecified muscle, fascia and tendon at shoulder and upper arm level, left arm, initial encounter: Secondary | ICD-10-CM | POA: Diagnosis not present

## 2017-11-04 DIAGNOSIS — K219 Gastro-esophageal reflux disease without esophagitis: Secondary | ICD-10-CM | POA: Diagnosis not present

## 2017-11-04 DIAGNOSIS — Z7984 Long term (current) use of oral hypoglycemic drugs: Secondary | ICD-10-CM | POA: Diagnosis not present

## 2017-11-04 DIAGNOSIS — M25512 Pain in left shoulder: Secondary | ICD-10-CM | POA: Diagnosis not present

## 2017-11-04 DIAGNOSIS — S4992XA Unspecified injury of left shoulder and upper arm, initial encounter: Secondary | ICD-10-CM | POA: Diagnosis not present

## 2017-11-04 DIAGNOSIS — J449 Chronic obstructive pulmonary disease, unspecified: Secondary | ICD-10-CM | POA: Diagnosis not present

## 2017-11-04 DIAGNOSIS — Z79899 Other long term (current) drug therapy: Secondary | ICD-10-CM | POA: Diagnosis not present

## 2017-11-23 DIAGNOSIS — J441 Chronic obstructive pulmonary disease with (acute) exacerbation: Secondary | ICD-10-CM | POA: Diagnosis not present

## 2017-11-23 DIAGNOSIS — J45909 Unspecified asthma, uncomplicated: Secondary | ICD-10-CM | POA: Diagnosis not present

## 2017-12-06 DIAGNOSIS — Z791 Long term (current) use of non-steroidal anti-inflammatories (NSAID): Secondary | ICD-10-CM | POA: Diagnosis not present

## 2017-12-06 DIAGNOSIS — Z794 Long term (current) use of insulin: Secondary | ICD-10-CM | POA: Diagnosis not present

## 2017-12-06 DIAGNOSIS — E785 Hyperlipidemia, unspecified: Secondary | ICD-10-CM | POA: Diagnosis not present

## 2017-12-06 DIAGNOSIS — Z7951 Long term (current) use of inhaled steroids: Secondary | ICD-10-CM | POA: Diagnosis not present

## 2017-12-06 DIAGNOSIS — E119 Type 2 diabetes mellitus without complications: Secondary | ICD-10-CM | POA: Diagnosis not present

## 2017-12-06 DIAGNOSIS — J449 Chronic obstructive pulmonary disease, unspecified: Secondary | ICD-10-CM | POA: Diagnosis not present

## 2017-12-06 DIAGNOSIS — I10 Essential (primary) hypertension: Secondary | ICD-10-CM | POA: Diagnosis not present

## 2017-12-06 DIAGNOSIS — M545 Low back pain: Secondary | ICD-10-CM | POA: Diagnosis not present

## 2017-12-06 DIAGNOSIS — J45909 Unspecified asthma, uncomplicated: Secondary | ICD-10-CM | POA: Diagnosis not present

## 2017-12-06 DIAGNOSIS — Z9981 Dependence on supplemental oxygen: Secondary | ICD-10-CM | POA: Diagnosis not present

## 2017-12-09 DIAGNOSIS — J441 Chronic obstructive pulmonary disease with (acute) exacerbation: Secondary | ICD-10-CM | POA: Diagnosis not present

## 2017-12-09 DIAGNOSIS — E119 Type 2 diabetes mellitus without complications: Secondary | ICD-10-CM | POA: Diagnosis not present

## 2017-12-09 DIAGNOSIS — J4520 Mild intermittent asthma, uncomplicated: Secondary | ICD-10-CM | POA: Diagnosis not present

## 2017-12-09 DIAGNOSIS — M545 Low back pain: Secondary | ICD-10-CM | POA: Diagnosis not present

## 2017-12-09 DIAGNOSIS — I10 Essential (primary) hypertension: Secondary | ICD-10-CM | POA: Diagnosis not present

## 2017-12-24 DIAGNOSIS — J45909 Unspecified asthma, uncomplicated: Secondary | ICD-10-CM | POA: Diagnosis not present

## 2017-12-24 DIAGNOSIS — J441 Chronic obstructive pulmonary disease with (acute) exacerbation: Secondary | ICD-10-CM | POA: Diagnosis not present

## 2018-01-23 DIAGNOSIS — J441 Chronic obstructive pulmonary disease with (acute) exacerbation: Secondary | ICD-10-CM | POA: Diagnosis not present

## 2018-01-23 DIAGNOSIS — J45909 Unspecified asthma, uncomplicated: Secondary | ICD-10-CM | POA: Diagnosis not present

## 2018-01-25 DIAGNOSIS — J4540 Moderate persistent asthma, uncomplicated: Secondary | ICD-10-CM | POA: Diagnosis not present

## 2018-01-25 DIAGNOSIS — I10 Essential (primary) hypertension: Secondary | ICD-10-CM | POA: Diagnosis not present

## 2018-01-25 DIAGNOSIS — J4541 Moderate persistent asthma with (acute) exacerbation: Secondary | ICD-10-CM | POA: Diagnosis not present

## 2018-01-25 DIAGNOSIS — Z0000 Encounter for general adult medical examination without abnormal findings: Secondary | ICD-10-CM | POA: Diagnosis not present

## 2018-01-25 DIAGNOSIS — E119 Type 2 diabetes mellitus without complications: Secondary | ICD-10-CM | POA: Diagnosis not present

## 2018-01-27 DIAGNOSIS — M810 Age-related osteoporosis without current pathological fracture: Secondary | ICD-10-CM | POA: Diagnosis not present

## 2018-01-27 DIAGNOSIS — M8588 Other specified disorders of bone density and structure, other site: Secondary | ICD-10-CM | POA: Diagnosis not present

## 2018-02-07 DIAGNOSIS — E119 Type 2 diabetes mellitus without complications: Secondary | ICD-10-CM | POA: Diagnosis not present

## 2018-02-07 DIAGNOSIS — M545 Low back pain: Secondary | ICD-10-CM | POA: Diagnosis not present

## 2018-02-07 DIAGNOSIS — J441 Chronic obstructive pulmonary disease with (acute) exacerbation: Secondary | ICD-10-CM | POA: Diagnosis not present

## 2018-02-07 DIAGNOSIS — I10 Essential (primary) hypertension: Secondary | ICD-10-CM | POA: Diagnosis not present

## 2018-02-07 DIAGNOSIS — J4520 Mild intermittent asthma, uncomplicated: Secondary | ICD-10-CM | POA: Diagnosis not present

## 2018-02-16 DIAGNOSIS — R928 Other abnormal and inconclusive findings on diagnostic imaging of breast: Secondary | ICD-10-CM | POA: Diagnosis not present

## 2018-02-23 DIAGNOSIS — J441 Chronic obstructive pulmonary disease with (acute) exacerbation: Secondary | ICD-10-CM | POA: Diagnosis not present

## 2018-02-23 DIAGNOSIS — J45909 Unspecified asthma, uncomplicated: Secondary | ICD-10-CM | POA: Diagnosis not present

## 2018-03-26 DIAGNOSIS — J45909 Unspecified asthma, uncomplicated: Secondary | ICD-10-CM | POA: Diagnosis not present

## 2018-03-26 DIAGNOSIS — J441 Chronic obstructive pulmonary disease with (acute) exacerbation: Secondary | ICD-10-CM | POA: Diagnosis not present

## 2018-04-05 DIAGNOSIS — E119 Type 2 diabetes mellitus without complications: Secondary | ICD-10-CM | POA: Diagnosis not present

## 2018-04-25 DIAGNOSIS — J45909 Unspecified asthma, uncomplicated: Secondary | ICD-10-CM | POA: Diagnosis not present

## 2018-04-25 DIAGNOSIS — J441 Chronic obstructive pulmonary disease with (acute) exacerbation: Secondary | ICD-10-CM | POA: Diagnosis not present

## 2018-05-03 DIAGNOSIS — J4540 Moderate persistent asthma, uncomplicated: Secondary | ICD-10-CM | POA: Diagnosis not present

## 2018-05-03 DIAGNOSIS — E119 Type 2 diabetes mellitus without complications: Secondary | ICD-10-CM | POA: Diagnosis not present

## 2018-05-03 DIAGNOSIS — I10 Essential (primary) hypertension: Secondary | ICD-10-CM | POA: Diagnosis not present

## 2018-05-26 DIAGNOSIS — J45909 Unspecified asthma, uncomplicated: Secondary | ICD-10-CM | POA: Diagnosis not present

## 2018-05-26 DIAGNOSIS — J441 Chronic obstructive pulmonary disease with (acute) exacerbation: Secondary | ICD-10-CM | POA: Diagnosis not present

## 2018-06-02 DIAGNOSIS — Z791 Long term (current) use of non-steroidal anti-inflammatories (NSAID): Secondary | ICD-10-CM | POA: Diagnosis not present

## 2018-06-02 DIAGNOSIS — Z794 Long term (current) use of insulin: Secondary | ICD-10-CM | POA: Diagnosis not present

## 2018-06-02 DIAGNOSIS — J449 Chronic obstructive pulmonary disease, unspecified: Secondary | ICD-10-CM | POA: Diagnosis not present

## 2018-06-02 DIAGNOSIS — J45909 Unspecified asthma, uncomplicated: Secondary | ICD-10-CM | POA: Diagnosis not present

## 2018-06-02 DIAGNOSIS — Z9981 Dependence on supplemental oxygen: Secondary | ICD-10-CM | POA: Diagnosis not present

## 2018-06-02 DIAGNOSIS — I10 Essential (primary) hypertension: Secondary | ICD-10-CM | POA: Diagnosis not present

## 2018-06-02 DIAGNOSIS — M545 Low back pain: Secondary | ICD-10-CM | POA: Diagnosis not present

## 2018-06-02 DIAGNOSIS — E119 Type 2 diabetes mellitus without complications: Secondary | ICD-10-CM | POA: Diagnosis not present

## 2018-06-02 DIAGNOSIS — E785 Hyperlipidemia, unspecified: Secondary | ICD-10-CM | POA: Diagnosis not present

## 2018-06-02 DIAGNOSIS — Z7951 Long term (current) use of inhaled steroids: Secondary | ICD-10-CM | POA: Diagnosis not present

## 2018-06-06 DIAGNOSIS — E7800 Pure hypercholesterolemia, unspecified: Secondary | ICD-10-CM | POA: Diagnosis not present

## 2018-06-06 DIAGNOSIS — J4541 Moderate persistent asthma with (acute) exacerbation: Secondary | ICD-10-CM | POA: Diagnosis not present

## 2018-06-06 DIAGNOSIS — J449 Chronic obstructive pulmonary disease, unspecified: Secondary | ICD-10-CM | POA: Diagnosis not present

## 2018-06-06 DIAGNOSIS — I110 Hypertensive heart disease with heart failure: Secondary | ICD-10-CM | POA: Diagnosis not present

## 2018-06-06 DIAGNOSIS — I509 Heart failure, unspecified: Secondary | ICD-10-CM | POA: Diagnosis not present

## 2018-06-06 DIAGNOSIS — Z79899 Other long term (current) drug therapy: Secondary | ICD-10-CM | POA: Diagnosis not present

## 2018-06-06 DIAGNOSIS — Z87891 Personal history of nicotine dependence: Secondary | ICD-10-CM | POA: Diagnosis not present

## 2018-06-07 DIAGNOSIS — M545 Low back pain: Secondary | ICD-10-CM | POA: Diagnosis not present

## 2018-06-07 DIAGNOSIS — J45909 Unspecified asthma, uncomplicated: Secondary | ICD-10-CM | POA: Diagnosis not present

## 2018-06-07 DIAGNOSIS — J449 Chronic obstructive pulmonary disease, unspecified: Secondary | ICD-10-CM | POA: Diagnosis not present

## 2018-06-07 DIAGNOSIS — I10 Essential (primary) hypertension: Secondary | ICD-10-CM | POA: Diagnosis not present

## 2018-06-07 DIAGNOSIS — E119 Type 2 diabetes mellitus without complications: Secondary | ICD-10-CM | POA: Diagnosis not present

## 2018-06-25 DIAGNOSIS — J45909 Unspecified asthma, uncomplicated: Secondary | ICD-10-CM | POA: Diagnosis not present

## 2018-06-25 DIAGNOSIS — J441 Chronic obstructive pulmonary disease with (acute) exacerbation: Secondary | ICD-10-CM | POA: Diagnosis not present

## 2018-07-26 DIAGNOSIS — J45909 Unspecified asthma, uncomplicated: Secondary | ICD-10-CM | POA: Diagnosis not present

## 2018-07-26 DIAGNOSIS — J441 Chronic obstructive pulmonary disease with (acute) exacerbation: Secondary | ICD-10-CM | POA: Diagnosis not present

## 2018-08-04 DIAGNOSIS — E119 Type 2 diabetes mellitus without complications: Secondary | ICD-10-CM | POA: Diagnosis not present

## 2018-08-06 DIAGNOSIS — E119 Type 2 diabetes mellitus without complications: Secondary | ICD-10-CM | POA: Diagnosis not present

## 2018-08-06 DIAGNOSIS — I10 Essential (primary) hypertension: Secondary | ICD-10-CM | POA: Diagnosis not present

## 2018-08-06 DIAGNOSIS — M545 Low back pain: Secondary | ICD-10-CM | POA: Diagnosis not present

## 2018-08-06 DIAGNOSIS — J45909 Unspecified asthma, uncomplicated: Secondary | ICD-10-CM | POA: Diagnosis not present

## 2018-08-06 DIAGNOSIS — J449 Chronic obstructive pulmonary disease, unspecified: Secondary | ICD-10-CM | POA: Diagnosis not present

## 2018-08-26 DIAGNOSIS — J441 Chronic obstructive pulmonary disease with (acute) exacerbation: Secondary | ICD-10-CM | POA: Diagnosis not present

## 2018-08-26 DIAGNOSIS — J45909 Unspecified asthma, uncomplicated: Secondary | ICD-10-CM | POA: Diagnosis not present

## 2018-08-30 DIAGNOSIS — E119 Type 2 diabetes mellitus without complications: Secondary | ICD-10-CM | POA: Diagnosis not present

## 2018-08-30 DIAGNOSIS — Z0000 Encounter for general adult medical examination without abnormal findings: Secondary | ICD-10-CM | POA: Diagnosis not present

## 2018-08-30 DIAGNOSIS — Z1389 Encounter for screening for other disorder: Secondary | ICD-10-CM | POA: Diagnosis not present

## 2018-08-30 DIAGNOSIS — J4540 Moderate persistent asthma, uncomplicated: Secondary | ICD-10-CM | POA: Diagnosis not present

## 2018-08-30 DIAGNOSIS — I10 Essential (primary) hypertension: Secondary | ICD-10-CM | POA: Diagnosis not present

## 2018-09-24 DIAGNOSIS — J45909 Unspecified asthma, uncomplicated: Secondary | ICD-10-CM | POA: Diagnosis not present

## 2018-09-24 DIAGNOSIS — R05 Cough: Secondary | ICD-10-CM | POA: Diagnosis not present

## 2018-09-24 DIAGNOSIS — R062 Wheezing: Secondary | ICD-10-CM | POA: Diagnosis not present

## 2018-10-05 DIAGNOSIS — I10 Essential (primary) hypertension: Secondary | ICD-10-CM | POA: Diagnosis not present

## 2018-10-05 DIAGNOSIS — E119 Type 2 diabetes mellitus without complications: Secondary | ICD-10-CM | POA: Diagnosis not present

## 2018-10-05 DIAGNOSIS — J449 Chronic obstructive pulmonary disease, unspecified: Secondary | ICD-10-CM | POA: Diagnosis not present

## 2018-10-05 DIAGNOSIS — M545 Low back pain: Secondary | ICD-10-CM | POA: Diagnosis not present

## 2018-10-05 DIAGNOSIS — J45909 Unspecified asthma, uncomplicated: Secondary | ICD-10-CM | POA: Diagnosis not present

## 2018-10-10 DIAGNOSIS — I10 Essential (primary) hypertension: Secondary | ICD-10-CM | POA: Diagnosis not present

## 2018-10-10 DIAGNOSIS — M47816 Spondylosis without myelopathy or radiculopathy, lumbar region: Secondary | ICD-10-CM | POA: Diagnosis not present

## 2018-10-10 DIAGNOSIS — R05 Cough: Secondary | ICD-10-CM | POA: Diagnosis not present

## 2018-10-10 DIAGNOSIS — G8929 Other chronic pain: Secondary | ICD-10-CM | POA: Diagnosis not present

## 2018-10-10 DIAGNOSIS — Z79899 Other long term (current) drug therapy: Secondary | ICD-10-CM | POA: Diagnosis not present

## 2018-10-10 DIAGNOSIS — M545 Low back pain: Secondary | ICD-10-CM | POA: Diagnosis not present

## 2018-10-10 DIAGNOSIS — E039 Hypothyroidism, unspecified: Secondary | ICD-10-CM | POA: Diagnosis not present

## 2018-10-10 DIAGNOSIS — M109 Gout, unspecified: Secondary | ICD-10-CM | POA: Diagnosis not present

## 2018-10-10 DIAGNOSIS — Z880 Allergy status to penicillin: Secondary | ICD-10-CM | POA: Diagnosis not present

## 2018-10-10 DIAGNOSIS — E785 Hyperlipidemia, unspecified: Secondary | ICD-10-CM | POA: Diagnosis not present

## 2018-10-10 DIAGNOSIS — Z7984 Long term (current) use of oral hypoglycemic drugs: Secondary | ICD-10-CM | POA: Diagnosis not present

## 2018-10-10 DIAGNOSIS — R0602 Shortness of breath: Secondary | ICD-10-CM | POA: Diagnosis not present

## 2018-10-10 DIAGNOSIS — K449 Diaphragmatic hernia without obstruction or gangrene: Secondary | ICD-10-CM | POA: Diagnosis not present

## 2018-10-10 DIAGNOSIS — J441 Chronic obstructive pulmonary disease with (acute) exacerbation: Secondary | ICD-10-CM | POA: Diagnosis not present

## 2018-10-10 DIAGNOSIS — Z780 Asymptomatic menopausal state: Secondary | ICD-10-CM | POA: Diagnosis not present

## 2018-10-10 DIAGNOSIS — E119 Type 2 diabetes mellitus without complications: Secondary | ICD-10-CM | POA: Diagnosis not present

## 2018-10-10 DIAGNOSIS — M546 Pain in thoracic spine: Secondary | ICD-10-CM | POA: Diagnosis not present

## 2018-10-11 DIAGNOSIS — Z880 Allergy status to penicillin: Secondary | ICD-10-CM | POA: Diagnosis not present

## 2018-10-11 DIAGNOSIS — I10 Essential (primary) hypertension: Secondary | ICD-10-CM | POA: Diagnosis not present

## 2018-10-11 DIAGNOSIS — Z79899 Other long term (current) drug therapy: Secondary | ICD-10-CM | POA: Diagnosis not present

## 2018-10-11 DIAGNOSIS — E039 Hypothyroidism, unspecified: Secondary | ICD-10-CM | POA: Diagnosis not present

## 2018-10-11 DIAGNOSIS — G8929 Other chronic pain: Secondary | ICD-10-CM | POA: Diagnosis not present

## 2018-10-11 DIAGNOSIS — M109 Gout, unspecified: Secondary | ICD-10-CM | POA: Diagnosis not present

## 2018-10-11 DIAGNOSIS — K449 Diaphragmatic hernia without obstruction or gangrene: Secondary | ICD-10-CM | POA: Diagnosis not present

## 2018-10-11 DIAGNOSIS — Z780 Asymptomatic menopausal state: Secondary | ICD-10-CM | POA: Diagnosis not present

## 2018-10-11 DIAGNOSIS — M545 Low back pain: Secondary | ICD-10-CM | POA: Diagnosis not present

## 2018-10-11 DIAGNOSIS — J441 Chronic obstructive pulmonary disease with (acute) exacerbation: Secondary | ICD-10-CM | POA: Diagnosis not present

## 2018-10-11 DIAGNOSIS — Z7984 Long term (current) use of oral hypoglycemic drugs: Secondary | ICD-10-CM | POA: Diagnosis not present

## 2018-10-11 DIAGNOSIS — E785 Hyperlipidemia, unspecified: Secondary | ICD-10-CM | POA: Diagnosis not present

## 2018-10-11 DIAGNOSIS — E119 Type 2 diabetes mellitus without complications: Secondary | ICD-10-CM | POA: Diagnosis not present

## 2018-10-12 DIAGNOSIS — M545 Low back pain: Secondary | ICD-10-CM | POA: Diagnosis not present

## 2018-10-12 DIAGNOSIS — M109 Gout, unspecified: Secondary | ICD-10-CM | POA: Diagnosis not present

## 2018-10-12 DIAGNOSIS — Z79899 Other long term (current) drug therapy: Secondary | ICD-10-CM | POA: Diagnosis not present

## 2018-10-12 DIAGNOSIS — J441 Chronic obstructive pulmonary disease with (acute) exacerbation: Secondary | ICD-10-CM | POA: Diagnosis not present

## 2018-10-12 DIAGNOSIS — E785 Hyperlipidemia, unspecified: Secondary | ICD-10-CM | POA: Diagnosis not present

## 2018-10-12 DIAGNOSIS — Z7984 Long term (current) use of oral hypoglycemic drugs: Secondary | ICD-10-CM | POA: Diagnosis not present

## 2018-10-12 DIAGNOSIS — E119 Type 2 diabetes mellitus without complications: Secondary | ICD-10-CM | POA: Diagnosis not present

## 2018-10-12 DIAGNOSIS — G8929 Other chronic pain: Secondary | ICD-10-CM | POA: Diagnosis not present

## 2018-10-12 DIAGNOSIS — Z780 Asymptomatic menopausal state: Secondary | ICD-10-CM | POA: Diagnosis not present

## 2018-10-12 DIAGNOSIS — E039 Hypothyroidism, unspecified: Secondary | ICD-10-CM | POA: Diagnosis not present

## 2018-10-12 DIAGNOSIS — I10 Essential (primary) hypertension: Secondary | ICD-10-CM | POA: Diagnosis not present

## 2018-10-12 DIAGNOSIS — K449 Diaphragmatic hernia without obstruction or gangrene: Secondary | ICD-10-CM | POA: Diagnosis not present

## 2018-10-12 DIAGNOSIS — Z880 Allergy status to penicillin: Secondary | ICD-10-CM | POA: Diagnosis not present

## 2018-10-13 DIAGNOSIS — E785 Hyperlipidemia, unspecified: Secondary | ICD-10-CM | POA: Diagnosis not present

## 2018-10-13 DIAGNOSIS — K449 Diaphragmatic hernia without obstruction or gangrene: Secondary | ICD-10-CM | POA: Diagnosis not present

## 2018-10-13 DIAGNOSIS — E039 Hypothyroidism, unspecified: Secondary | ICD-10-CM | POA: Diagnosis not present

## 2018-10-13 DIAGNOSIS — G8929 Other chronic pain: Secondary | ICD-10-CM | POA: Diagnosis not present

## 2018-10-13 DIAGNOSIS — M109 Gout, unspecified: Secondary | ICD-10-CM | POA: Diagnosis not present

## 2018-10-13 DIAGNOSIS — J441 Chronic obstructive pulmonary disease with (acute) exacerbation: Secondary | ICD-10-CM | POA: Diagnosis not present

## 2018-10-13 DIAGNOSIS — Z780 Asymptomatic menopausal state: Secondary | ICD-10-CM | POA: Diagnosis not present

## 2018-10-13 DIAGNOSIS — Z7984 Long term (current) use of oral hypoglycemic drugs: Secondary | ICD-10-CM | POA: Diagnosis not present

## 2018-10-13 DIAGNOSIS — I10 Essential (primary) hypertension: Secondary | ICD-10-CM | POA: Diagnosis not present

## 2018-10-13 DIAGNOSIS — E119 Type 2 diabetes mellitus without complications: Secondary | ICD-10-CM | POA: Diagnosis not present

## 2018-10-13 DIAGNOSIS — Z880 Allergy status to penicillin: Secondary | ICD-10-CM | POA: Diagnosis not present

## 2018-10-13 DIAGNOSIS — Z79899 Other long term (current) drug therapy: Secondary | ICD-10-CM | POA: Diagnosis not present

## 2018-10-13 DIAGNOSIS — M545 Low back pain: Secondary | ICD-10-CM | POA: Diagnosis not present

## 2018-10-14 DIAGNOSIS — Z780 Asymptomatic menopausal state: Secondary | ICD-10-CM | POA: Diagnosis not present

## 2018-10-14 DIAGNOSIS — Z880 Allergy status to penicillin: Secondary | ICD-10-CM | POA: Diagnosis not present

## 2018-10-14 DIAGNOSIS — Z7984 Long term (current) use of oral hypoglycemic drugs: Secondary | ICD-10-CM | POA: Diagnosis not present

## 2018-10-14 DIAGNOSIS — M545 Low back pain: Secondary | ICD-10-CM | POA: Diagnosis not present

## 2018-10-14 DIAGNOSIS — E039 Hypothyroidism, unspecified: Secondary | ICD-10-CM | POA: Diagnosis not present

## 2018-10-14 DIAGNOSIS — M109 Gout, unspecified: Secondary | ICD-10-CM | POA: Diagnosis not present

## 2018-10-14 DIAGNOSIS — E785 Hyperlipidemia, unspecified: Secondary | ICD-10-CM | POA: Diagnosis not present

## 2018-10-14 DIAGNOSIS — E119 Type 2 diabetes mellitus without complications: Secondary | ICD-10-CM | POA: Diagnosis not present

## 2018-10-14 DIAGNOSIS — G8929 Other chronic pain: Secondary | ICD-10-CM | POA: Diagnosis not present

## 2018-10-14 DIAGNOSIS — I10 Essential (primary) hypertension: Secondary | ICD-10-CM | POA: Diagnosis not present

## 2018-10-14 DIAGNOSIS — Z79899 Other long term (current) drug therapy: Secondary | ICD-10-CM | POA: Diagnosis not present

## 2018-10-14 DIAGNOSIS — J441 Chronic obstructive pulmonary disease with (acute) exacerbation: Secondary | ICD-10-CM | POA: Diagnosis not present

## 2018-10-14 DIAGNOSIS — K449 Diaphragmatic hernia without obstruction or gangrene: Secondary | ICD-10-CM | POA: Diagnosis not present

## 2018-10-15 DIAGNOSIS — E039 Hypothyroidism, unspecified: Secondary | ICD-10-CM | POA: Diagnosis not present

## 2018-10-15 DIAGNOSIS — Z7984 Long term (current) use of oral hypoglycemic drugs: Secondary | ICD-10-CM | POA: Diagnosis not present

## 2018-10-15 DIAGNOSIS — E119 Type 2 diabetes mellitus without complications: Secondary | ICD-10-CM | POA: Diagnosis not present

## 2018-10-15 DIAGNOSIS — M109 Gout, unspecified: Secondary | ICD-10-CM | POA: Diagnosis not present

## 2018-10-15 DIAGNOSIS — M545 Low back pain: Secondary | ICD-10-CM | POA: Diagnosis not present

## 2018-10-15 DIAGNOSIS — J441 Chronic obstructive pulmonary disease with (acute) exacerbation: Secondary | ICD-10-CM | POA: Diagnosis not present

## 2018-10-15 DIAGNOSIS — Z880 Allergy status to penicillin: Secondary | ICD-10-CM | POA: Diagnosis not present

## 2018-10-15 DIAGNOSIS — G8929 Other chronic pain: Secondary | ICD-10-CM | POA: Diagnosis not present

## 2018-10-15 DIAGNOSIS — I10 Essential (primary) hypertension: Secondary | ICD-10-CM | POA: Diagnosis not present

## 2018-10-15 DIAGNOSIS — Z780 Asymptomatic menopausal state: Secondary | ICD-10-CM | POA: Diagnosis not present

## 2018-10-15 DIAGNOSIS — E785 Hyperlipidemia, unspecified: Secondary | ICD-10-CM | POA: Diagnosis not present

## 2018-10-15 DIAGNOSIS — K449 Diaphragmatic hernia without obstruction or gangrene: Secondary | ICD-10-CM | POA: Diagnosis not present

## 2018-10-15 DIAGNOSIS — Z79899 Other long term (current) drug therapy: Secondary | ICD-10-CM | POA: Diagnosis not present

## 2018-10-18 DIAGNOSIS — J441 Chronic obstructive pulmonary disease with (acute) exacerbation: Secondary | ICD-10-CM | POA: Diagnosis not present

## 2018-10-18 DIAGNOSIS — G8929 Other chronic pain: Secondary | ICD-10-CM | POA: Diagnosis not present

## 2018-10-18 DIAGNOSIS — Z9981 Dependence on supplemental oxygen: Secondary | ICD-10-CM | POA: Diagnosis not present

## 2018-10-18 DIAGNOSIS — M545 Low back pain: Secondary | ICD-10-CM | POA: Diagnosis not present

## 2018-10-18 DIAGNOSIS — E119 Type 2 diabetes mellitus without complications: Secondary | ICD-10-CM | POA: Diagnosis not present

## 2018-10-18 DIAGNOSIS — E785 Hyperlipidemia, unspecified: Secondary | ICD-10-CM | POA: Diagnosis not present

## 2018-10-18 DIAGNOSIS — E039 Hypothyroidism, unspecified: Secondary | ICD-10-CM | POA: Diagnosis not present

## 2018-10-18 DIAGNOSIS — Z7952 Long term (current) use of systemic steroids: Secondary | ICD-10-CM | POA: Diagnosis not present

## 2018-10-18 DIAGNOSIS — I10 Essential (primary) hypertension: Secondary | ICD-10-CM | POA: Diagnosis not present

## 2018-10-18 DIAGNOSIS — Z7984 Long term (current) use of oral hypoglycemic drugs: Secondary | ICD-10-CM | POA: Diagnosis not present

## 2018-10-22 DIAGNOSIS — Z7984 Long term (current) use of oral hypoglycemic drugs: Secondary | ICD-10-CM | POA: Diagnosis not present

## 2018-10-22 DIAGNOSIS — E039 Hypothyroidism, unspecified: Secondary | ICD-10-CM | POA: Diagnosis not present

## 2018-10-22 DIAGNOSIS — I10 Essential (primary) hypertension: Secondary | ICD-10-CM | POA: Diagnosis not present

## 2018-10-22 DIAGNOSIS — Z9981 Dependence on supplemental oxygen: Secondary | ICD-10-CM | POA: Diagnosis not present

## 2018-10-22 DIAGNOSIS — G8929 Other chronic pain: Secondary | ICD-10-CM | POA: Diagnosis not present

## 2018-10-22 DIAGNOSIS — M545 Low back pain: Secondary | ICD-10-CM | POA: Diagnosis not present

## 2018-10-22 DIAGNOSIS — E785 Hyperlipidemia, unspecified: Secondary | ICD-10-CM | POA: Diagnosis not present

## 2018-10-22 DIAGNOSIS — J441 Chronic obstructive pulmonary disease with (acute) exacerbation: Secondary | ICD-10-CM | POA: Diagnosis not present

## 2018-10-22 DIAGNOSIS — E119 Type 2 diabetes mellitus without complications: Secondary | ICD-10-CM | POA: Diagnosis not present

## 2018-10-22 DIAGNOSIS — Z7952 Long term (current) use of systemic steroids: Secondary | ICD-10-CM | POA: Diagnosis not present

## 2018-10-24 DIAGNOSIS — M545 Low back pain: Secondary | ICD-10-CM | POA: Diagnosis not present

## 2018-10-24 DIAGNOSIS — J4540 Moderate persistent asthma, uncomplicated: Secondary | ICD-10-CM | POA: Diagnosis not present

## 2018-10-24 DIAGNOSIS — J449 Chronic obstructive pulmonary disease, unspecified: Secondary | ICD-10-CM | POA: Diagnosis not present

## 2018-10-25 DIAGNOSIS — E785 Hyperlipidemia, unspecified: Secondary | ICD-10-CM | POA: Diagnosis not present

## 2018-10-25 DIAGNOSIS — J45909 Unspecified asthma, uncomplicated: Secondary | ICD-10-CM | POA: Diagnosis not present

## 2018-10-25 DIAGNOSIS — J441 Chronic obstructive pulmonary disease with (acute) exacerbation: Secondary | ICD-10-CM | POA: Diagnosis not present

## 2018-10-25 DIAGNOSIS — E039 Hypothyroidism, unspecified: Secondary | ICD-10-CM | POA: Diagnosis not present

## 2018-10-25 DIAGNOSIS — R05 Cough: Secondary | ICD-10-CM | POA: Diagnosis not present

## 2018-10-25 DIAGNOSIS — G8929 Other chronic pain: Secondary | ICD-10-CM | POA: Diagnosis not present

## 2018-10-25 DIAGNOSIS — M545 Low back pain: Secondary | ICD-10-CM | POA: Diagnosis not present

## 2018-10-25 DIAGNOSIS — R062 Wheezing: Secondary | ICD-10-CM | POA: Diagnosis not present

## 2018-10-25 DIAGNOSIS — Z9981 Dependence on supplemental oxygen: Secondary | ICD-10-CM | POA: Diagnosis not present

## 2018-10-25 DIAGNOSIS — I10 Essential (primary) hypertension: Secondary | ICD-10-CM | POA: Diagnosis not present

## 2018-10-25 DIAGNOSIS — Z7984 Long term (current) use of oral hypoglycemic drugs: Secondary | ICD-10-CM | POA: Diagnosis not present

## 2018-10-25 DIAGNOSIS — E119 Type 2 diabetes mellitus without complications: Secondary | ICD-10-CM | POA: Diagnosis not present

## 2018-10-25 DIAGNOSIS — Z7952 Long term (current) use of systemic steroids: Secondary | ICD-10-CM | POA: Diagnosis not present

## 2018-10-29 DIAGNOSIS — Z7952 Long term (current) use of systemic steroids: Secondary | ICD-10-CM | POA: Diagnosis not present

## 2018-10-29 DIAGNOSIS — Z9981 Dependence on supplemental oxygen: Secondary | ICD-10-CM | POA: Diagnosis not present

## 2018-10-29 DIAGNOSIS — I10 Essential (primary) hypertension: Secondary | ICD-10-CM | POA: Diagnosis not present

## 2018-10-29 DIAGNOSIS — E039 Hypothyroidism, unspecified: Secondary | ICD-10-CM | POA: Diagnosis not present

## 2018-10-29 DIAGNOSIS — E785 Hyperlipidemia, unspecified: Secondary | ICD-10-CM | POA: Diagnosis not present

## 2018-10-29 DIAGNOSIS — J441 Chronic obstructive pulmonary disease with (acute) exacerbation: Secondary | ICD-10-CM | POA: Diagnosis not present

## 2018-10-29 DIAGNOSIS — G8929 Other chronic pain: Secondary | ICD-10-CM | POA: Diagnosis not present

## 2018-10-29 DIAGNOSIS — Z7984 Long term (current) use of oral hypoglycemic drugs: Secondary | ICD-10-CM | POA: Diagnosis not present

## 2018-10-29 DIAGNOSIS — M545 Low back pain: Secondary | ICD-10-CM | POA: Diagnosis not present

## 2018-10-29 DIAGNOSIS — E119 Type 2 diabetes mellitus without complications: Secondary | ICD-10-CM | POA: Diagnosis not present

## 2018-10-31 DIAGNOSIS — J441 Chronic obstructive pulmonary disease with (acute) exacerbation: Secondary | ICD-10-CM | POA: Diagnosis not present

## 2018-10-31 DIAGNOSIS — I10 Essential (primary) hypertension: Secondary | ICD-10-CM | POA: Diagnosis not present

## 2018-10-31 DIAGNOSIS — G8929 Other chronic pain: Secondary | ICD-10-CM | POA: Diagnosis not present

## 2018-10-31 DIAGNOSIS — Z7984 Long term (current) use of oral hypoglycemic drugs: Secondary | ICD-10-CM | POA: Diagnosis not present

## 2018-10-31 DIAGNOSIS — Z9981 Dependence on supplemental oxygen: Secondary | ICD-10-CM | POA: Diagnosis not present

## 2018-10-31 DIAGNOSIS — E039 Hypothyroidism, unspecified: Secondary | ICD-10-CM | POA: Diagnosis not present

## 2018-10-31 DIAGNOSIS — Z7952 Long term (current) use of systemic steroids: Secondary | ICD-10-CM | POA: Diagnosis not present

## 2018-10-31 DIAGNOSIS — M545 Low back pain: Secondary | ICD-10-CM | POA: Diagnosis not present

## 2018-10-31 DIAGNOSIS — E785 Hyperlipidemia, unspecified: Secondary | ICD-10-CM | POA: Diagnosis not present

## 2018-10-31 DIAGNOSIS — E119 Type 2 diabetes mellitus without complications: Secondary | ICD-10-CM | POA: Diagnosis not present

## 2018-11-18 DIAGNOSIS — R05 Cough: Secondary | ICD-10-CM | POA: Diagnosis not present

## 2018-11-18 DIAGNOSIS — J45909 Unspecified asthma, uncomplicated: Secondary | ICD-10-CM | POA: Diagnosis not present

## 2018-11-24 DIAGNOSIS — R062 Wheezing: Secondary | ICD-10-CM | POA: Diagnosis not present

## 2018-11-24 DIAGNOSIS — R05 Cough: Secondary | ICD-10-CM | POA: Diagnosis not present

## 2018-11-24 DIAGNOSIS — J45909 Unspecified asthma, uncomplicated: Secondary | ICD-10-CM | POA: Diagnosis not present

## 2018-12-17 DIAGNOSIS — G8929 Other chronic pain: Secondary | ICD-10-CM | POA: Diagnosis not present

## 2018-12-17 DIAGNOSIS — I10 Essential (primary) hypertension: Secondary | ICD-10-CM | POA: Diagnosis not present

## 2018-12-17 DIAGNOSIS — M545 Low back pain: Secondary | ICD-10-CM | POA: Diagnosis not present

## 2018-12-17 DIAGNOSIS — E119 Type 2 diabetes mellitus without complications: Secondary | ICD-10-CM | POA: Diagnosis not present

## 2018-12-17 DIAGNOSIS — J441 Chronic obstructive pulmonary disease with (acute) exacerbation: Secondary | ICD-10-CM | POA: Diagnosis not present

## 2018-12-25 DIAGNOSIS — J45909 Unspecified asthma, uncomplicated: Secondary | ICD-10-CM | POA: Diagnosis not present

## 2018-12-25 DIAGNOSIS — R05 Cough: Secondary | ICD-10-CM | POA: Diagnosis not present

## 2018-12-25 DIAGNOSIS — R062 Wheezing: Secondary | ICD-10-CM | POA: Diagnosis not present

## 2019-01-24 DIAGNOSIS — Z0000 Encounter for general adult medical examination without abnormal findings: Secondary | ICD-10-CM | POA: Diagnosis not present

## 2019-01-24 DIAGNOSIS — R062 Wheezing: Secondary | ICD-10-CM | POA: Diagnosis not present

## 2019-01-24 DIAGNOSIS — J449 Chronic obstructive pulmonary disease, unspecified: Secondary | ICD-10-CM | POA: Diagnosis not present

## 2019-01-24 DIAGNOSIS — R05 Cough: Secondary | ICD-10-CM | POA: Diagnosis not present

## 2019-01-24 DIAGNOSIS — J45909 Unspecified asthma, uncomplicated: Secondary | ICD-10-CM | POA: Diagnosis not present

## 2019-01-24 DIAGNOSIS — I872 Venous insufficiency (chronic) (peripheral): Secondary | ICD-10-CM | POA: Diagnosis not present

## 2019-01-24 DIAGNOSIS — M545 Low back pain: Secondary | ICD-10-CM | POA: Diagnosis not present

## 2019-01-30 ENCOUNTER — Encounter: Payer: Self-pay | Admitting: *Deleted

## 2019-01-30 ENCOUNTER — Other Ambulatory Visit: Payer: Self-pay | Admitting: *Deleted

## 2019-01-30 NOTE — Patient Outreach (Signed)
Referral received from [employer] high risk list, pt with diagnosis COPD on oxygen, DM, HTN, outreach call to pt, spoke with pt, HIPAA verified, pt reports she is managing well at home with assistance of CAP program and adult children, pt reports she has prefilled med packs and takes all medications as prescribed,  RN CM reviewed medications over the phone and pt states she would like for RN CM to discuss the medications again with CNA to make sure " everything is correct, especially the nebulizer meds, you'll need to ask her"  Pt feels COPD/ asthma is her biggest challenge and would like copy of COPD booklet.  Pt needs an eye and dental exam as she reports it has been awhile since she had either.  Rn CM encouraged pt to make these appointments, pt states she will need to work with CNA and adult children's schedule.  Pt states she has all needed DME and in process of getting shower seat.  RN CM faxed today's note and barrier letter to primary MD.  Outpatient Encounter Medications as of 01/30/2019  Medication Sig  . albuterol (PROAIR HFA) 108 (90 BASE) MCG/ACT inhaler Inhale 1 puff into the lungs every 6 (six) hours as needed for wheezing or shortness of breath.  . cloNIDine (CATAPRES) 0.2 MG tablet Take 0.2 mg by mouth 2 (two) times daily.  . DiphenhydrAMINE HCl (ALLERGY MEDICATION PO) Take 1 tablet by mouth daily.  Marland Kitchen esomeprazole (NEXIUM) 40 MG capsule Take 40 mg by mouth daily at 12 noon.  Marland Kitchen guaiFENesin (MUCINEX) 600 MG 12 hr tablet Take 1,200 mg by mouth 2 (two) times daily.  Marland Kitchen HYDROcodone-acetaminophen (NORCO/VICODIN) 5-325 MG per tablet Take 2 tablets by mouth every 4 (four) hours as needed.  . labetalol (NORMODYNE) 300 MG tablet Take 300 mg by mouth 2 (two) times daily.  . potassium chloride SA (K-DUR,KLOR-CON) 20 MEQ tablet Take 20 mEq by mouth 2 (two) times daily.  . simvastatin (ZOCOR) 20 MG tablet Take 20 mg by mouth daily.  . ciprofloxacin (CIPRO) 500 MG tablet Take 1 tablet (500 mg  total) by mouth every 12 (twelve) hours. (Patient not taking: Reported on 01/30/2019)  . doxycycline (VIBRAMYCIN) 100 MG capsule Take 1 capsule (100 mg total) by mouth 2 (two) times daily. (Patient not taking: Reported on 01/30/2019)  . ondansetron (ZOFRAN ODT) 8 MG disintegrating tablet Take 1 tablet (8 mg total) by mouth every 8 (eight) hours as needed for nausea or vomiting. (Patient not taking: Reported on 01/30/2019)   No facility-administered encounter medications on file as of 01/30/2019.     THN CM Care Plan Problem One     Most Recent Value  Care Plan Problem One  Knowledge deficit related to COPD  Role Documenting the Problem One  Care Management Coordinator  Care Plan for Problem One  Active  THN Long Term Goal   Pt will demonstrate improved self care for COPD within 60 days  THN Long Term Goal Start Date  01/30/19  Interventions for Problem One Long Term Goal  RN CM established plan of care with pt, mailed successful outreach letter to pt home with 24 hour nurse line magnet, pamphlet, COPD booklet, reviewed medications  THN CM Short Term Goal #1   Pt will verbalize COPD action plan within 30 days  THN CM Short Term Goal #1 Start Date  01/30/19  Interventions for Short Term Goal #1  RN CM mailed copy of COPD action plan to pt home, reviewed action plan with  pt, pt states she is in green zone today  THN CM Short Term Goal #2   Pt will identify medications (and purpose)used to manage COPD within 30 days  THN CM Short Term Goal #2 Start Date  01/30/19  Interventions for Short Term Goal #2  Pt states meds are prepackaged by [facility], pt states she has nebulizer but RN CM will need to wait until CNA is there to review the name of the medication,  Rn CM reviewed importance of using inhalers, nebulizer as prescribed for management of COPD / asthma      PLAN Update the medications with CNA at next phone call end of this week Reinforce COPD action plan Outreach pt by phone for telephone  assessment next month  Jacqlyn Larsen [facility], [place] Coordinator [phone]

## 2019-02-03 ENCOUNTER — Other Ambulatory Visit: Payer: Self-pay | Admitting: *Deleted

## 2019-02-03 NOTE — Patient Outreach (Signed)
Outreach call to pt for follow up on medication reconciliation with patient's CNA (per pt request to make sure medications for COPD/ nebulizer are up to date)  Spoke with pt who reports her CNA is not there yet and not exactly sure when she will be there.  PLAN Call back within 3-4 business days to talk with CNA  Jacqlyn Larsen [facility], BSN [place] Coordinator [phone]

## 2019-02-08 ENCOUNTER — Other Ambulatory Visit: Payer: Self-pay | Admitting: *Deleted

## 2019-02-08 NOTE — Patient Outreach (Signed)
Outreach call to pt for collaboration with CNA for medication reconciliation, spoke with pt, permission given to speak with CNA Anne Ng, reviewed medications, Anne Ng states pt takes metformin for diabetes but she is unable to verify the dosage citing " I can't see it on here but she definitely takes it",  she states that next time RN CM calls, they will "have the correct dosage for you" ,CNA states she assists pt to check CBG QOD with readings usually in 90's range and the highest reading was 110.   RN CM called [facility],spoke with Caryl Pina who verified pt does take metformin 500mg  q am and this is included in prefilled med packs.    PLAN Outreach pt next month for telephone assessment  Jacqlyn Larsen [facility], [place] Coordinator [phone]

## 2019-02-09 DIAGNOSIS — R05 Cough: Secondary | ICD-10-CM | POA: Diagnosis not present

## 2019-02-09 DIAGNOSIS — R062 Wheezing: Secondary | ICD-10-CM | POA: Diagnosis not present

## 2019-02-10 DIAGNOSIS — I890 Lymphedema, not elsewhere classified: Secondary | ICD-10-CM | POA: Diagnosis not present

## 2019-02-10 DIAGNOSIS — Z7984 Long term (current) use of oral hypoglycemic drugs: Secondary | ICD-10-CM | POA: Diagnosis not present

## 2019-02-10 DIAGNOSIS — Z79899 Other long term (current) drug therapy: Secondary | ICD-10-CM | POA: Diagnosis not present

## 2019-02-10 DIAGNOSIS — I872 Venous insufficiency (chronic) (peripheral): Secondary | ICD-10-CM | POA: Diagnosis not present

## 2019-02-10 DIAGNOSIS — E119 Type 2 diabetes mellitus without complications: Secondary | ICD-10-CM | POA: Diagnosis not present

## 2019-02-10 DIAGNOSIS — L97919 Non-pressure chronic ulcer of unspecified part of right lower leg with unspecified severity: Secondary | ICD-10-CM | POA: Diagnosis not present

## 2019-02-10 DIAGNOSIS — J449 Chronic obstructive pulmonary disease, unspecified: Secondary | ICD-10-CM | POA: Diagnosis not present

## 2019-02-14 DIAGNOSIS — E1151 Type 2 diabetes mellitus with diabetic peripheral angiopathy without gangrene: Secondary | ICD-10-CM | POA: Diagnosis not present

## 2019-02-14 DIAGNOSIS — Z7984 Long term (current) use of oral hypoglycemic drugs: Secondary | ICD-10-CM | POA: Diagnosis not present

## 2019-02-14 DIAGNOSIS — L97229 Non-pressure chronic ulcer of left calf with unspecified severity: Secondary | ICD-10-CM | POA: Diagnosis not present

## 2019-02-14 DIAGNOSIS — Z9981 Dependence on supplemental oxygen: Secondary | ICD-10-CM | POA: Diagnosis not present

## 2019-02-14 DIAGNOSIS — J449 Chronic obstructive pulmonary disease, unspecified: Secondary | ICD-10-CM | POA: Diagnosis not present

## 2019-02-14 DIAGNOSIS — M545 Low back pain: Secondary | ICD-10-CM | POA: Diagnosis not present

## 2019-02-14 DIAGNOSIS — Z4800 Encounter for change or removal of nonsurgical wound dressing: Secondary | ICD-10-CM | POA: Diagnosis not present

## 2019-02-14 DIAGNOSIS — I872 Venous insufficiency (chronic) (peripheral): Secondary | ICD-10-CM | POA: Diagnosis not present

## 2019-02-15 DIAGNOSIS — J449 Chronic obstructive pulmonary disease, unspecified: Secondary | ICD-10-CM | POA: Diagnosis not present

## 2019-02-15 DIAGNOSIS — J441 Chronic obstructive pulmonary disease with (acute) exacerbation: Secondary | ICD-10-CM | POA: Diagnosis not present

## 2019-02-15 DIAGNOSIS — E119 Type 2 diabetes mellitus without complications: Secondary | ICD-10-CM | POA: Diagnosis not present

## 2019-02-15 DIAGNOSIS — M545 Low back pain: Secondary | ICD-10-CM | POA: Diagnosis not present

## 2019-02-15 DIAGNOSIS — L97229 Non-pressure chronic ulcer of left calf with unspecified severity: Secondary | ICD-10-CM | POA: Diagnosis not present

## 2019-02-15 DIAGNOSIS — Z4800 Encounter for change or removal of nonsurgical wound dressing: Secondary | ICD-10-CM | POA: Diagnosis not present

## 2019-02-15 DIAGNOSIS — E1151 Type 2 diabetes mellitus with diabetic peripheral angiopathy without gangrene: Secondary | ICD-10-CM | POA: Diagnosis not present

## 2019-02-15 DIAGNOSIS — I10 Essential (primary) hypertension: Secondary | ICD-10-CM | POA: Diagnosis not present

## 2019-02-15 DIAGNOSIS — Z7984 Long term (current) use of oral hypoglycemic drugs: Secondary | ICD-10-CM | POA: Diagnosis not present

## 2019-02-15 DIAGNOSIS — G8929 Other chronic pain: Secondary | ICD-10-CM | POA: Diagnosis not present

## 2019-02-15 DIAGNOSIS — I872 Venous insufficiency (chronic) (peripheral): Secondary | ICD-10-CM | POA: Diagnosis not present

## 2019-02-15 DIAGNOSIS — Z9981 Dependence on supplemental oxygen: Secondary | ICD-10-CM | POA: Diagnosis not present

## 2019-02-16 DIAGNOSIS — M79662 Pain in left lower leg: Secondary | ICD-10-CM | POA: Diagnosis not present

## 2019-02-16 DIAGNOSIS — R600 Localized edema: Secondary | ICD-10-CM | POA: Diagnosis not present

## 2019-02-16 DIAGNOSIS — I87312 Chronic venous hypertension (idiopathic) with ulcer of left lower extremity: Secondary | ICD-10-CM | POA: Diagnosis not present

## 2019-02-17 DIAGNOSIS — M545 Low back pain: Secondary | ICD-10-CM | POA: Diagnosis not present

## 2019-02-17 DIAGNOSIS — Z9981 Dependence on supplemental oxygen: Secondary | ICD-10-CM | POA: Diagnosis not present

## 2019-02-17 DIAGNOSIS — E1151 Type 2 diabetes mellitus with diabetic peripheral angiopathy without gangrene: Secondary | ICD-10-CM | POA: Diagnosis not present

## 2019-02-17 DIAGNOSIS — L97229 Non-pressure chronic ulcer of left calf with unspecified severity: Secondary | ICD-10-CM | POA: Diagnosis not present

## 2019-02-17 DIAGNOSIS — J449 Chronic obstructive pulmonary disease, unspecified: Secondary | ICD-10-CM | POA: Diagnosis not present

## 2019-02-17 DIAGNOSIS — Z4800 Encounter for change or removal of nonsurgical wound dressing: Secondary | ICD-10-CM | POA: Diagnosis not present

## 2019-02-17 DIAGNOSIS — I872 Venous insufficiency (chronic) (peripheral): Secondary | ICD-10-CM | POA: Diagnosis not present

## 2019-02-17 DIAGNOSIS — Z7984 Long term (current) use of oral hypoglycemic drugs: Secondary | ICD-10-CM | POA: Diagnosis not present

## 2019-02-20 DIAGNOSIS — L97229 Non-pressure chronic ulcer of left calf with unspecified severity: Secondary | ICD-10-CM | POA: Diagnosis not present

## 2019-02-20 DIAGNOSIS — L97921 Non-pressure chronic ulcer of unspecified part of left lower leg limited to breakdown of skin: Secondary | ICD-10-CM | POA: Diagnosis not present

## 2019-02-20 DIAGNOSIS — Z4800 Encounter for change or removal of nonsurgical wound dressing: Secondary | ICD-10-CM | POA: Diagnosis not present

## 2019-02-20 DIAGNOSIS — I87312 Chronic venous hypertension (idiopathic) with ulcer of left lower extremity: Secondary | ICD-10-CM | POA: Diagnosis not present

## 2019-02-20 DIAGNOSIS — E1151 Type 2 diabetes mellitus with diabetic peripheral angiopathy without gangrene: Secondary | ICD-10-CM | POA: Diagnosis not present

## 2019-02-20 DIAGNOSIS — M79662 Pain in left lower leg: Secondary | ICD-10-CM | POA: Diagnosis not present

## 2019-02-20 DIAGNOSIS — I872 Venous insufficiency (chronic) (peripheral): Secondary | ICD-10-CM | POA: Diagnosis not present

## 2019-02-20 DIAGNOSIS — Z7984 Long term (current) use of oral hypoglycemic drugs: Secondary | ICD-10-CM | POA: Diagnosis not present

## 2019-02-20 DIAGNOSIS — J449 Chronic obstructive pulmonary disease, unspecified: Secondary | ICD-10-CM | POA: Diagnosis not present

## 2019-02-20 DIAGNOSIS — M545 Low back pain: Secondary | ICD-10-CM | POA: Diagnosis not present

## 2019-02-20 DIAGNOSIS — Z9981 Dependence on supplemental oxygen: Secondary | ICD-10-CM | POA: Diagnosis not present

## 2019-02-22 DIAGNOSIS — J449 Chronic obstructive pulmonary disease, unspecified: Secondary | ICD-10-CM | POA: Diagnosis not present

## 2019-02-22 DIAGNOSIS — Z7984 Long term (current) use of oral hypoglycemic drugs: Secondary | ICD-10-CM | POA: Diagnosis not present

## 2019-02-22 DIAGNOSIS — M545 Low back pain: Secondary | ICD-10-CM | POA: Diagnosis not present

## 2019-02-22 DIAGNOSIS — Z9981 Dependence on supplemental oxygen: Secondary | ICD-10-CM | POA: Diagnosis not present

## 2019-02-22 DIAGNOSIS — Z4800 Encounter for change or removal of nonsurgical wound dressing: Secondary | ICD-10-CM | POA: Diagnosis not present

## 2019-02-22 DIAGNOSIS — I872 Venous insufficiency (chronic) (peripheral): Secondary | ICD-10-CM | POA: Diagnosis not present

## 2019-02-22 DIAGNOSIS — E1151 Type 2 diabetes mellitus with diabetic peripheral angiopathy without gangrene: Secondary | ICD-10-CM | POA: Diagnosis not present

## 2019-02-22 DIAGNOSIS — L97229 Non-pressure chronic ulcer of left calf with unspecified severity: Secondary | ICD-10-CM | POA: Diagnosis not present

## 2019-02-24 DIAGNOSIS — I872 Venous insufficiency (chronic) (peripheral): Secondary | ICD-10-CM | POA: Diagnosis not present

## 2019-02-24 DIAGNOSIS — Z7984 Long term (current) use of oral hypoglycemic drugs: Secondary | ICD-10-CM | POA: Diagnosis not present

## 2019-02-24 DIAGNOSIS — R05 Cough: Secondary | ICD-10-CM | POA: Diagnosis not present

## 2019-02-24 DIAGNOSIS — J45909 Unspecified asthma, uncomplicated: Secondary | ICD-10-CM | POA: Diagnosis not present

## 2019-02-24 DIAGNOSIS — R062 Wheezing: Secondary | ICD-10-CM | POA: Diagnosis not present

## 2019-02-24 DIAGNOSIS — Z4800 Encounter for change or removal of nonsurgical wound dressing: Secondary | ICD-10-CM | POA: Diagnosis not present

## 2019-02-24 DIAGNOSIS — Z9981 Dependence on supplemental oxygen: Secondary | ICD-10-CM | POA: Diagnosis not present

## 2019-02-24 DIAGNOSIS — L97229 Non-pressure chronic ulcer of left calf with unspecified severity: Secondary | ICD-10-CM | POA: Diagnosis not present

## 2019-02-24 DIAGNOSIS — E1151 Type 2 diabetes mellitus with diabetic peripheral angiopathy without gangrene: Secondary | ICD-10-CM | POA: Diagnosis not present

## 2019-02-24 DIAGNOSIS — J449 Chronic obstructive pulmonary disease, unspecified: Secondary | ICD-10-CM | POA: Diagnosis not present

## 2019-02-24 DIAGNOSIS — M545 Low back pain: Secondary | ICD-10-CM | POA: Diagnosis not present

## 2019-02-27 ENCOUNTER — Encounter: Payer: Self-pay | Admitting: *Deleted

## 2019-02-27 ENCOUNTER — Other Ambulatory Visit: Payer: Self-pay | Admitting: *Deleted

## 2019-02-27 DIAGNOSIS — I872 Venous insufficiency (chronic) (peripheral): Secondary | ICD-10-CM | POA: Diagnosis not present

## 2019-02-27 DIAGNOSIS — L97229 Non-pressure chronic ulcer of left calf with unspecified severity: Secondary | ICD-10-CM | POA: Diagnosis not present

## 2019-02-27 DIAGNOSIS — Z7984 Long term (current) use of oral hypoglycemic drugs: Secondary | ICD-10-CM | POA: Diagnosis not present

## 2019-02-27 DIAGNOSIS — Z9981 Dependence on supplemental oxygen: Secondary | ICD-10-CM | POA: Diagnosis not present

## 2019-02-27 DIAGNOSIS — J449 Chronic obstructive pulmonary disease, unspecified: Secondary | ICD-10-CM | POA: Diagnosis not present

## 2019-02-27 DIAGNOSIS — M545 Low back pain: Secondary | ICD-10-CM | POA: Diagnosis not present

## 2019-02-27 DIAGNOSIS — E1151 Type 2 diabetes mellitus with diabetic peripheral angiopathy without gangrene: Secondary | ICD-10-CM | POA: Diagnosis not present

## 2019-02-27 DIAGNOSIS — Z4800 Encounter for change or removal of nonsurgical wound dressing: Secondary | ICD-10-CM | POA: Diagnosis not present

## 2019-02-27 NOTE — Patient Outreach (Signed)
Outreach call to pt for telephone assessment, spoke with pt, HIPAA verified, pt reports she continues checking CBG QOD with reading today 100 and most readings 90-100 range.  Pt states " my breathing is pretty good" continues using oxygen prn.  Pt reports she went to wound care center about "open places on her leg" and home health nurse provides wound care every 2 days and will be seeing pt today.  Pt states she is to return to wound center this Friday 03/03/19.  RN CM discussed correlation between wound healing and diabetes, importance of keeping CBG within normal limits.  THN CM Care Plan Problem One     Most Recent Value  Care Plan Problem One  Knowledge deficit related to COPD  Role Documenting the Problem One  Care Management Coordinator  Care Plan for Problem One  Active  THN Long Term Goal   Pt will demonstrate improved self care for COPD within 60 days  THN Long Term Goal Start Date  01/30/19  Interventions for Problem One Long Term Goal  RN CM reviewed plan of care with pt, pt reports she did receive COPD booklet in the mail and will look through, pt states she has all medications on hand and taking as prescribed  THN CM Short Term Goal #1   Pt will verbalize COPD action plan within 30 days  THN CM Short Term Goal #1 Start Date  02/27/19 [goal restarted, needs reinforcement]  Interventions for Short Term Goal #1  RN CM reviewed COPD action plan, pt states she is in green zone  [facility] CM Short Term Goal #2   Pt will identify medications (and purpose)used to manage COPD within 30 days  THN CM Short Term Goal #2 Start Date  01/30/19  [facility] CM Short Term Goal #2 Met Date  02/27/19  Interventions for Short Term Goal #2  Pt reports she has all medications and taking as prescribed, verbalizes understanding of purpose of medications      PLAN Outreach pt next month for telephone assessment Reinforce COPD action plan  Jacqlyn Larsen [facility], [place] Coordinator [phone]

## 2019-03-01 DIAGNOSIS — I872 Venous insufficiency (chronic) (peripheral): Secondary | ICD-10-CM | POA: Diagnosis not present

## 2019-03-01 DIAGNOSIS — M545 Low back pain: Secondary | ICD-10-CM | POA: Diagnosis not present

## 2019-03-01 DIAGNOSIS — E1151 Type 2 diabetes mellitus with diabetic peripheral angiopathy without gangrene: Secondary | ICD-10-CM | POA: Diagnosis not present

## 2019-03-01 DIAGNOSIS — Z9981 Dependence on supplemental oxygen: Secondary | ICD-10-CM | POA: Diagnosis not present

## 2019-03-01 DIAGNOSIS — Z4800 Encounter for change or removal of nonsurgical wound dressing: Secondary | ICD-10-CM | POA: Diagnosis not present

## 2019-03-01 DIAGNOSIS — J449 Chronic obstructive pulmonary disease, unspecified: Secondary | ICD-10-CM | POA: Diagnosis not present

## 2019-03-01 DIAGNOSIS — L97229 Non-pressure chronic ulcer of left calf with unspecified severity: Secondary | ICD-10-CM | POA: Diagnosis not present

## 2019-03-01 DIAGNOSIS — Z7984 Long term (current) use of oral hypoglycemic drugs: Secondary | ICD-10-CM | POA: Diagnosis not present

## 2019-03-02 DIAGNOSIS — J449 Chronic obstructive pulmonary disease, unspecified: Secondary | ICD-10-CM | POA: Diagnosis not present

## 2019-03-03 DIAGNOSIS — L97929 Non-pressure chronic ulcer of unspecified part of left lower leg with unspecified severity: Secondary | ICD-10-CM | POA: Diagnosis not present

## 2019-03-03 DIAGNOSIS — I872 Venous insufficiency (chronic) (peripheral): Secondary | ICD-10-CM | POA: Diagnosis not present

## 2019-03-06 DIAGNOSIS — L97229 Non-pressure chronic ulcer of left calf with unspecified severity: Secondary | ICD-10-CM | POA: Diagnosis not present

## 2019-03-06 DIAGNOSIS — Z9981 Dependence on supplemental oxygen: Secondary | ICD-10-CM | POA: Diagnosis not present

## 2019-03-06 DIAGNOSIS — E1151 Type 2 diabetes mellitus with diabetic peripheral angiopathy without gangrene: Secondary | ICD-10-CM | POA: Diagnosis not present

## 2019-03-06 DIAGNOSIS — M545 Low back pain: Secondary | ICD-10-CM | POA: Diagnosis not present

## 2019-03-06 DIAGNOSIS — Z7984 Long term (current) use of oral hypoglycemic drugs: Secondary | ICD-10-CM | POA: Diagnosis not present

## 2019-03-06 DIAGNOSIS — I872 Venous insufficiency (chronic) (peripheral): Secondary | ICD-10-CM | POA: Diagnosis not present

## 2019-03-06 DIAGNOSIS — Z4800 Encounter for change or removal of nonsurgical wound dressing: Secondary | ICD-10-CM | POA: Diagnosis not present

## 2019-03-06 DIAGNOSIS — J449 Chronic obstructive pulmonary disease, unspecified: Secondary | ICD-10-CM | POA: Diagnosis not present

## 2019-03-10 DIAGNOSIS — M545 Low back pain: Secondary | ICD-10-CM | POA: Diagnosis not present

## 2019-03-10 DIAGNOSIS — L97229 Non-pressure chronic ulcer of left calf with unspecified severity: Secondary | ICD-10-CM | POA: Diagnosis not present

## 2019-03-10 DIAGNOSIS — Z9981 Dependence on supplemental oxygen: Secondary | ICD-10-CM | POA: Diagnosis not present

## 2019-03-10 DIAGNOSIS — Z7984 Long term (current) use of oral hypoglycemic drugs: Secondary | ICD-10-CM | POA: Diagnosis not present

## 2019-03-10 DIAGNOSIS — J449 Chronic obstructive pulmonary disease, unspecified: Secondary | ICD-10-CM | POA: Diagnosis not present

## 2019-03-10 DIAGNOSIS — I872 Venous insufficiency (chronic) (peripheral): Secondary | ICD-10-CM | POA: Diagnosis not present

## 2019-03-10 DIAGNOSIS — Z4800 Encounter for change or removal of nonsurgical wound dressing: Secondary | ICD-10-CM | POA: Diagnosis not present

## 2019-03-10 DIAGNOSIS — E1151 Type 2 diabetes mellitus with diabetic peripheral angiopathy without gangrene: Secondary | ICD-10-CM | POA: Diagnosis not present

## 2019-03-13 DIAGNOSIS — J449 Chronic obstructive pulmonary disease, unspecified: Secondary | ICD-10-CM | POA: Diagnosis not present

## 2019-03-13 DIAGNOSIS — I872 Venous insufficiency (chronic) (peripheral): Secondary | ICD-10-CM | POA: Diagnosis not present

## 2019-03-13 DIAGNOSIS — Z7984 Long term (current) use of oral hypoglycemic drugs: Secondary | ICD-10-CM | POA: Diagnosis not present

## 2019-03-13 DIAGNOSIS — Z4800 Encounter for change or removal of nonsurgical wound dressing: Secondary | ICD-10-CM | POA: Diagnosis not present

## 2019-03-13 DIAGNOSIS — Z9981 Dependence on supplemental oxygen: Secondary | ICD-10-CM | POA: Diagnosis not present

## 2019-03-13 DIAGNOSIS — L97229 Non-pressure chronic ulcer of left calf with unspecified severity: Secondary | ICD-10-CM | POA: Diagnosis not present

## 2019-03-13 DIAGNOSIS — E1151 Type 2 diabetes mellitus with diabetic peripheral angiopathy without gangrene: Secondary | ICD-10-CM | POA: Diagnosis not present

## 2019-03-13 DIAGNOSIS — M545 Low back pain: Secondary | ICD-10-CM | POA: Diagnosis not present

## 2019-03-16 DIAGNOSIS — Z7984 Long term (current) use of oral hypoglycemic drugs: Secondary | ICD-10-CM | POA: Diagnosis not present

## 2019-03-16 DIAGNOSIS — J449 Chronic obstructive pulmonary disease, unspecified: Secondary | ICD-10-CM | POA: Diagnosis not present

## 2019-03-16 DIAGNOSIS — L97229 Non-pressure chronic ulcer of left calf with unspecified severity: Secondary | ICD-10-CM | POA: Diagnosis not present

## 2019-03-16 DIAGNOSIS — Z4800 Encounter for change or removal of nonsurgical wound dressing: Secondary | ICD-10-CM | POA: Diagnosis not present

## 2019-03-16 DIAGNOSIS — E1151 Type 2 diabetes mellitus with diabetic peripheral angiopathy without gangrene: Secondary | ICD-10-CM | POA: Diagnosis not present

## 2019-03-16 DIAGNOSIS — M545 Low back pain: Secondary | ICD-10-CM | POA: Diagnosis not present

## 2019-03-16 DIAGNOSIS — Z9981 Dependence on supplemental oxygen: Secondary | ICD-10-CM | POA: Diagnosis not present

## 2019-03-16 DIAGNOSIS — I872 Venous insufficiency (chronic) (peripheral): Secondary | ICD-10-CM | POA: Diagnosis not present

## 2019-03-20 DIAGNOSIS — M545 Low back pain: Secondary | ICD-10-CM | POA: Diagnosis not present

## 2019-03-20 DIAGNOSIS — Z9981 Dependence on supplemental oxygen: Secondary | ICD-10-CM | POA: Diagnosis not present

## 2019-03-20 DIAGNOSIS — Z7984 Long term (current) use of oral hypoglycemic drugs: Secondary | ICD-10-CM | POA: Diagnosis not present

## 2019-03-20 DIAGNOSIS — J449 Chronic obstructive pulmonary disease, unspecified: Secondary | ICD-10-CM | POA: Diagnosis not present

## 2019-03-20 DIAGNOSIS — E1151 Type 2 diabetes mellitus with diabetic peripheral angiopathy without gangrene: Secondary | ICD-10-CM | POA: Diagnosis not present

## 2019-03-20 DIAGNOSIS — Z4800 Encounter for change or removal of nonsurgical wound dressing: Secondary | ICD-10-CM | POA: Diagnosis not present

## 2019-03-20 DIAGNOSIS — L97229 Non-pressure chronic ulcer of left calf with unspecified severity: Secondary | ICD-10-CM | POA: Diagnosis not present

## 2019-03-20 DIAGNOSIS — I872 Venous insufficiency (chronic) (peripheral): Secondary | ICD-10-CM | POA: Diagnosis not present

## 2019-03-23 DIAGNOSIS — E119 Type 2 diabetes mellitus without complications: Secondary | ICD-10-CM | POA: Diagnosis not present

## 2019-03-23 DIAGNOSIS — J45909 Unspecified asthma, uncomplicated: Secondary | ICD-10-CM | POA: Diagnosis not present

## 2019-03-24 DIAGNOSIS — I872 Venous insufficiency (chronic) (peripheral): Secondary | ICD-10-CM | POA: Diagnosis not present

## 2019-03-24 DIAGNOSIS — L97929 Non-pressure chronic ulcer of unspecified part of left lower leg with unspecified severity: Secondary | ICD-10-CM | POA: Diagnosis not present

## 2019-03-27 DIAGNOSIS — J45909 Unspecified asthma, uncomplicated: Secondary | ICD-10-CM | POA: Diagnosis not present

## 2019-03-27 DIAGNOSIS — Z7984 Long term (current) use of oral hypoglycemic drugs: Secondary | ICD-10-CM | POA: Diagnosis not present

## 2019-03-27 DIAGNOSIS — L97229 Non-pressure chronic ulcer of left calf with unspecified severity: Secondary | ICD-10-CM | POA: Diagnosis not present

## 2019-03-27 DIAGNOSIS — R062 Wheezing: Secondary | ICD-10-CM | POA: Diagnosis not present

## 2019-03-27 DIAGNOSIS — Z4800 Encounter for change or removal of nonsurgical wound dressing: Secondary | ICD-10-CM | POA: Diagnosis not present

## 2019-03-27 DIAGNOSIS — J449 Chronic obstructive pulmonary disease, unspecified: Secondary | ICD-10-CM | POA: Diagnosis not present

## 2019-03-27 DIAGNOSIS — E1151 Type 2 diabetes mellitus with diabetic peripheral angiopathy without gangrene: Secondary | ICD-10-CM | POA: Diagnosis not present

## 2019-03-27 DIAGNOSIS — R05 Cough: Secondary | ICD-10-CM | POA: Diagnosis not present

## 2019-03-27 DIAGNOSIS — M545 Low back pain: Secondary | ICD-10-CM | POA: Diagnosis not present

## 2019-03-27 DIAGNOSIS — I872 Venous insufficiency (chronic) (peripheral): Secondary | ICD-10-CM | POA: Diagnosis not present

## 2019-03-27 DIAGNOSIS — Z9981 Dependence on supplemental oxygen: Secondary | ICD-10-CM | POA: Diagnosis not present

## 2019-03-28 ENCOUNTER — Other Ambulatory Visit: Payer: Self-pay | Admitting: *Deleted

## 2019-03-28 ENCOUNTER — Encounter: Payer: Self-pay | Admitting: *Deleted

## 2019-03-28 NOTE — Patient Outreach (Signed)
Outreach call to pt for telephone assessment, spoke with pt, HIPAA verified, pt reports her aide is with her now, home health continues to see pt and assist with wound care, pt continues going to wound care center and states " my leg is looking so much better"  Pt states "breathing is pretty good" and continues to use oxygen/ nebulizer and follow action plan.  Pt states CBG today 100 and usually in this range.  Pt reports no new concerns today and appreciative of phone call for teaching/reinforcement,  RN CM discussed discharge plan and will outreach pt again next month, then plan to transfer to RN health coach if pt agreeable.  THN CM Care Plan Problem One     Most Recent Value  Care Plan Problem One  Knowledge deficit related to COPD  Role Documenting the Problem One  Care Management Coordinator  Care Plan for Problem One  Active  THN Long Term Goal   Pt will demonstrate improved self care for COPD within 60 days  THN Long Term Goal Start Date  01/30/19  Interventions for Problem One Long Term Goal  RN CM reinforced plan of care with pt, pt reports she has all medications and taking as prescribed, continues to have assistance of CNA in the home with her daily M-F  THN CM Short Term Goal #1   Pt will verbalize COPD action plan within 30 days  THN CM Short Term Goal #1 Start Date  03/28/19 [goal-re-established]  Interventions for Short Term Goal #1  RN CM reinforced COPD action plan with emphasis on yellow zone, pt reports she is in green zone today  THN CM Short Term Goal #2   Pt will identify medications (and purpose)used to manage COPD within 30 days  THN CM Short Term Goal #2 Start Date  01/30/19  [facility] CM Short Term Goal #2 Met Date  02/27/19      PLAN Outreach pt for telephone assessment next month Plan to transfer to RN health coach  Jacqlyn Larsen [facility], [place] [phone]

## 2019-03-30 DIAGNOSIS — J449 Chronic obstructive pulmonary disease, unspecified: Secondary | ICD-10-CM | POA: Diagnosis not present

## 2019-03-30 DIAGNOSIS — Z9981 Dependence on supplemental oxygen: Secondary | ICD-10-CM | POA: Diagnosis not present

## 2019-03-30 DIAGNOSIS — Z4800 Encounter for change or removal of nonsurgical wound dressing: Secondary | ICD-10-CM | POA: Diagnosis not present

## 2019-03-30 DIAGNOSIS — Z7984 Long term (current) use of oral hypoglycemic drugs: Secondary | ICD-10-CM | POA: Diagnosis not present

## 2019-03-30 DIAGNOSIS — L97229 Non-pressure chronic ulcer of left calf with unspecified severity: Secondary | ICD-10-CM | POA: Diagnosis not present

## 2019-03-30 DIAGNOSIS — E1151 Type 2 diabetes mellitus with diabetic peripheral angiopathy without gangrene: Secondary | ICD-10-CM | POA: Diagnosis not present

## 2019-03-30 DIAGNOSIS — M545 Low back pain: Secondary | ICD-10-CM | POA: Diagnosis not present

## 2019-03-30 DIAGNOSIS — I872 Venous insufficiency (chronic) (peripheral): Secondary | ICD-10-CM | POA: Diagnosis not present

## 2019-04-02 DIAGNOSIS — J449 Chronic obstructive pulmonary disease, unspecified: Secondary | ICD-10-CM | POA: Diagnosis not present

## 2019-04-03 DIAGNOSIS — Z4800 Encounter for change or removal of nonsurgical wound dressing: Secondary | ICD-10-CM | POA: Diagnosis not present

## 2019-04-03 DIAGNOSIS — L97229 Non-pressure chronic ulcer of left calf with unspecified severity: Secondary | ICD-10-CM | POA: Diagnosis not present

## 2019-04-03 DIAGNOSIS — I872 Venous insufficiency (chronic) (peripheral): Secondary | ICD-10-CM | POA: Diagnosis not present

## 2019-04-03 DIAGNOSIS — E1151 Type 2 diabetes mellitus with diabetic peripheral angiopathy without gangrene: Secondary | ICD-10-CM | POA: Diagnosis not present

## 2019-04-03 DIAGNOSIS — Z9981 Dependence on supplemental oxygen: Secondary | ICD-10-CM | POA: Diagnosis not present

## 2019-04-03 DIAGNOSIS — J449 Chronic obstructive pulmonary disease, unspecified: Secondary | ICD-10-CM | POA: Diagnosis not present

## 2019-04-03 DIAGNOSIS — M545 Low back pain: Secondary | ICD-10-CM | POA: Diagnosis not present

## 2019-04-03 DIAGNOSIS — Z7984 Long term (current) use of oral hypoglycemic drugs: Secondary | ICD-10-CM | POA: Diagnosis not present

## 2019-04-06 DIAGNOSIS — Z9981 Dependence on supplemental oxygen: Secondary | ICD-10-CM | POA: Diagnosis not present

## 2019-04-06 DIAGNOSIS — J449 Chronic obstructive pulmonary disease, unspecified: Secondary | ICD-10-CM | POA: Diagnosis not present

## 2019-04-06 DIAGNOSIS — L97229 Non-pressure chronic ulcer of left calf with unspecified severity: Secondary | ICD-10-CM | POA: Diagnosis not present

## 2019-04-06 DIAGNOSIS — M545 Low back pain: Secondary | ICD-10-CM | POA: Diagnosis not present

## 2019-04-06 DIAGNOSIS — Z4800 Encounter for change or removal of nonsurgical wound dressing: Secondary | ICD-10-CM | POA: Diagnosis not present

## 2019-04-06 DIAGNOSIS — E1151 Type 2 diabetes mellitus with diabetic peripheral angiopathy without gangrene: Secondary | ICD-10-CM | POA: Diagnosis not present

## 2019-04-06 DIAGNOSIS — Z7984 Long term (current) use of oral hypoglycemic drugs: Secondary | ICD-10-CM | POA: Diagnosis not present

## 2019-04-06 DIAGNOSIS — I872 Venous insufficiency (chronic) (peripheral): Secondary | ICD-10-CM | POA: Diagnosis not present

## 2019-04-10 DIAGNOSIS — L97229 Non-pressure chronic ulcer of left calf with unspecified severity: Secondary | ICD-10-CM | POA: Diagnosis not present

## 2019-04-10 DIAGNOSIS — M545 Low back pain: Secondary | ICD-10-CM | POA: Diagnosis not present

## 2019-04-10 DIAGNOSIS — Z9981 Dependence on supplemental oxygen: Secondary | ICD-10-CM | POA: Diagnosis not present

## 2019-04-10 DIAGNOSIS — E1151 Type 2 diabetes mellitus with diabetic peripheral angiopathy without gangrene: Secondary | ICD-10-CM | POA: Diagnosis not present

## 2019-04-10 DIAGNOSIS — I872 Venous insufficiency (chronic) (peripheral): Secondary | ICD-10-CM | POA: Diagnosis not present

## 2019-04-10 DIAGNOSIS — J449 Chronic obstructive pulmonary disease, unspecified: Secondary | ICD-10-CM | POA: Diagnosis not present

## 2019-04-10 DIAGNOSIS — Z7984 Long term (current) use of oral hypoglycemic drugs: Secondary | ICD-10-CM | POA: Diagnosis not present

## 2019-04-10 DIAGNOSIS — Z4800 Encounter for change or removal of nonsurgical wound dressing: Secondary | ICD-10-CM | POA: Diagnosis not present

## 2019-04-13 DIAGNOSIS — Z7984 Long term (current) use of oral hypoglycemic drugs: Secondary | ICD-10-CM | POA: Diagnosis not present

## 2019-04-13 DIAGNOSIS — I872 Venous insufficiency (chronic) (peripheral): Secondary | ICD-10-CM | POA: Diagnosis not present

## 2019-04-13 DIAGNOSIS — Z9981 Dependence on supplemental oxygen: Secondary | ICD-10-CM | POA: Diagnosis not present

## 2019-04-13 DIAGNOSIS — L97229 Non-pressure chronic ulcer of left calf with unspecified severity: Secondary | ICD-10-CM | POA: Diagnosis not present

## 2019-04-13 DIAGNOSIS — M545 Low back pain: Secondary | ICD-10-CM | POA: Diagnosis not present

## 2019-04-13 DIAGNOSIS — J449 Chronic obstructive pulmonary disease, unspecified: Secondary | ICD-10-CM | POA: Diagnosis not present

## 2019-04-13 DIAGNOSIS — Z4800 Encounter for change or removal of nonsurgical wound dressing: Secondary | ICD-10-CM | POA: Diagnosis not present

## 2019-04-13 DIAGNOSIS — E1151 Type 2 diabetes mellitus with diabetic peripheral angiopathy without gangrene: Secondary | ICD-10-CM | POA: Diagnosis not present

## 2019-04-14 DIAGNOSIS — I872 Venous insufficiency (chronic) (peripheral): Secondary | ICD-10-CM | POA: Diagnosis not present

## 2019-04-14 DIAGNOSIS — L97929 Non-pressure chronic ulcer of unspecified part of left lower leg with unspecified severity: Secondary | ICD-10-CM | POA: Diagnosis not present

## 2019-04-16 DIAGNOSIS — J449 Chronic obstructive pulmonary disease, unspecified: Secondary | ICD-10-CM | POA: Diagnosis not present

## 2019-04-16 DIAGNOSIS — I110 Hypertensive heart disease with heart failure: Secondary | ICD-10-CM | POA: Diagnosis not present

## 2019-04-16 DIAGNOSIS — I509 Heart failure, unspecified: Secondary | ICD-10-CM | POA: Diagnosis not present

## 2019-04-16 DIAGNOSIS — I4891 Unspecified atrial fibrillation: Secondary | ICD-10-CM | POA: Diagnosis not present

## 2019-04-17 DIAGNOSIS — Z9981 Dependence on supplemental oxygen: Secondary | ICD-10-CM | POA: Diagnosis not present

## 2019-04-17 DIAGNOSIS — J449 Chronic obstructive pulmonary disease, unspecified: Secondary | ICD-10-CM | POA: Diagnosis not present

## 2019-04-17 DIAGNOSIS — M545 Low back pain: Secondary | ICD-10-CM | POA: Diagnosis not present

## 2019-04-17 DIAGNOSIS — E1151 Type 2 diabetes mellitus with diabetic peripheral angiopathy without gangrene: Secondary | ICD-10-CM | POA: Diagnosis not present

## 2019-04-17 DIAGNOSIS — Z4800 Encounter for change or removal of nonsurgical wound dressing: Secondary | ICD-10-CM | POA: Diagnosis not present

## 2019-04-17 DIAGNOSIS — Z7984 Long term (current) use of oral hypoglycemic drugs: Secondary | ICD-10-CM | POA: Diagnosis not present

## 2019-04-17 DIAGNOSIS — L97229 Non-pressure chronic ulcer of left calf with unspecified severity: Secondary | ICD-10-CM | POA: Diagnosis not present

## 2019-04-17 DIAGNOSIS — I872 Venous insufficiency (chronic) (peripheral): Secondary | ICD-10-CM | POA: Diagnosis not present

## 2019-04-21 DIAGNOSIS — I872 Venous insufficiency (chronic) (peripheral): Secondary | ICD-10-CM | POA: Diagnosis not present

## 2019-04-21 DIAGNOSIS — L97929 Non-pressure chronic ulcer of unspecified part of left lower leg with unspecified severity: Secondary | ICD-10-CM | POA: Diagnosis not present

## 2019-04-24 DIAGNOSIS — I872 Venous insufficiency (chronic) (peripheral): Secondary | ICD-10-CM | POA: Diagnosis not present

## 2019-04-24 DIAGNOSIS — E1151 Type 2 diabetes mellitus with diabetic peripheral angiopathy without gangrene: Secondary | ICD-10-CM | POA: Diagnosis not present

## 2019-04-24 DIAGNOSIS — M545 Low back pain: Secondary | ICD-10-CM | POA: Diagnosis not present

## 2019-04-24 DIAGNOSIS — Z7984 Long term (current) use of oral hypoglycemic drugs: Secondary | ICD-10-CM | POA: Diagnosis not present

## 2019-04-24 DIAGNOSIS — Z4800 Encounter for change or removal of nonsurgical wound dressing: Secondary | ICD-10-CM | POA: Diagnosis not present

## 2019-04-24 DIAGNOSIS — J449 Chronic obstructive pulmonary disease, unspecified: Secondary | ICD-10-CM | POA: Diagnosis not present

## 2019-04-24 DIAGNOSIS — Z9981 Dependence on supplemental oxygen: Secondary | ICD-10-CM | POA: Diagnosis not present

## 2019-04-24 DIAGNOSIS — L97229 Non-pressure chronic ulcer of left calf with unspecified severity: Secondary | ICD-10-CM | POA: Diagnosis not present

## 2019-04-25 ENCOUNTER — Other Ambulatory Visit: Payer: Self-pay | Admitting: *Deleted

## 2019-04-25 DIAGNOSIS — E1121 Type 2 diabetes mellitus with diabetic nephropathy: Secondary | ICD-10-CM | POA: Diagnosis not present

## 2019-04-25 DIAGNOSIS — E782 Mixed hyperlipidemia: Secondary | ICD-10-CM | POA: Diagnosis not present

## 2019-04-25 DIAGNOSIS — I872 Venous insufficiency (chronic) (peripheral): Secondary | ICD-10-CM | POA: Diagnosis not present

## 2019-04-25 DIAGNOSIS — J449 Chronic obstructive pulmonary disease, unspecified: Secondary | ICD-10-CM | POA: Diagnosis not present

## 2019-04-25 DIAGNOSIS — N182 Chronic kidney disease, stage 2 (mild): Secondary | ICD-10-CM | POA: Diagnosis not present

## 2019-04-25 DIAGNOSIS — E785 Hyperlipidemia, unspecified: Secondary | ICD-10-CM | POA: Diagnosis not present

## 2019-04-25 NOTE — Patient Outreach (Signed)
Outreach call to pt for telephone assessment, no answer to telephone [phone] and message states " voicemail not set up",  No answer to [phone] and message states " user busy"  RN CM mailed unsuccessful outreach letter to pt home.  PLAN Outreach pt in 3-4 business days  Jacqlyn Larsen [facility], [place] [phone]

## 2019-04-26 ENCOUNTER — Ambulatory Visit: Payer: Medicare Other | Admitting: *Deleted

## 2019-04-26 DIAGNOSIS — J45909 Unspecified asthma, uncomplicated: Secondary | ICD-10-CM | POA: Diagnosis not present

## 2019-04-26 DIAGNOSIS — R05 Cough: Secondary | ICD-10-CM | POA: Diagnosis not present

## 2019-04-26 DIAGNOSIS — R062 Wheezing: Secondary | ICD-10-CM | POA: Diagnosis not present

## 2019-04-28 ENCOUNTER — Other Ambulatory Visit: Payer: Self-pay | Admitting: *Deleted

## 2019-04-28 ENCOUNTER — Encounter: Payer: Self-pay | Admitting: *Deleted

## 2019-04-28 NOTE — Patient Outreach (Signed)
Outreach call to pt for telephone assessment, spoke with pt, HIPAA verified, pt states CNA is not available to speak with, she left to go get lunch for pt,  Pt reports " I'm doing pretty well"  Home heath continues to assist with wound care and pt continues attending wound center appointments and states " the wound to my left leg looks so much better"  Pt reports CBG "always around 100".  Pt states she is using inhalers as needed and is wearing her mask when leaving home, continues handwashing and pandemic precautions.  RN CM reminded pt get flu vaccine for this year if she has not done so, pt states she cannot remember if she got vaccine this year but will check with her aide.  No new concerns voiced,  RN CM reviewed discharge plan and will close case today.  RN CM faxed case closure letter to primary MD and mailed case closure letter to pt home address.  THN CM Care Plan Problem One     Most Recent Value  Care Plan Problem One  Knowledge deficit related to COPD  Role Documenting the Problem One  Care Management Coordinator  Care Plan for Problem One  Active  THN Long Term Goal   Pt will demonstrate improved self care for COPD within 60 days  THN Long Term Goal Start Date  01/30/19  [facility] Long Term Goal Met Date  04/28/19  Interventions for Problem One Long Term Goal  RN CM reviewed plan of care with pt, continues to have assistance with CNA services in the home, has all medications and reports taking as prescribed.  THN CM Short Term Goal #1   Pt will verbalize COPD action plan within 30 days  THN CM Short Term Goal #1 Start Date  03/28/19  [facility] CM Short Term Goal #1 Met Date  04/28/19  Interventions for Short Term Goal #1  RN CM reviewed COPD action plan, pt states she is in green zone today,  RN CM ask pt to call MD early for change in health status.  THN CM Short Term Goal #2   Pt will identify medications (and purpose)used to manage COPD within 30 days  THN CM Short Term Goal #2 Start Date   01/30/19  [facility] CM Short Term Goal #2 Met Date  02/27/19      PLAN Close case   Jacqlyn Larsen [facility], [place] Coordinator [phone]

## 2019-05-01 DIAGNOSIS — Z4800 Encounter for change or removal of nonsurgical wound dressing: Secondary | ICD-10-CM | POA: Diagnosis not present

## 2019-05-01 DIAGNOSIS — Z9981 Dependence on supplemental oxygen: Secondary | ICD-10-CM | POA: Diagnosis not present

## 2019-05-01 DIAGNOSIS — I872 Venous insufficiency (chronic) (peripheral): Secondary | ICD-10-CM | POA: Diagnosis not present

## 2019-05-01 DIAGNOSIS — Z7984 Long term (current) use of oral hypoglycemic drugs: Secondary | ICD-10-CM | POA: Diagnosis not present

## 2019-05-01 DIAGNOSIS — L97229 Non-pressure chronic ulcer of left calf with unspecified severity: Secondary | ICD-10-CM | POA: Diagnosis not present

## 2019-05-01 DIAGNOSIS — M545 Low back pain: Secondary | ICD-10-CM | POA: Diagnosis not present

## 2019-05-01 DIAGNOSIS — E1151 Type 2 diabetes mellitus with diabetic peripheral angiopathy without gangrene: Secondary | ICD-10-CM | POA: Diagnosis not present

## 2019-05-01 DIAGNOSIS — J449 Chronic obstructive pulmonary disease, unspecified: Secondary | ICD-10-CM | POA: Diagnosis not present

## 2019-05-02 DIAGNOSIS — J449 Chronic obstructive pulmonary disease, unspecified: Secondary | ICD-10-CM | POA: Diagnosis not present

## 2019-05-08 DIAGNOSIS — M545 Low back pain: Secondary | ICD-10-CM | POA: Diagnosis not present

## 2019-05-08 DIAGNOSIS — L97229 Non-pressure chronic ulcer of left calf with unspecified severity: Secondary | ICD-10-CM | POA: Diagnosis not present

## 2019-05-08 DIAGNOSIS — I872 Venous insufficiency (chronic) (peripheral): Secondary | ICD-10-CM | POA: Diagnosis not present

## 2019-05-08 DIAGNOSIS — Z9981 Dependence on supplemental oxygen: Secondary | ICD-10-CM | POA: Diagnosis not present

## 2019-05-08 DIAGNOSIS — E1151 Type 2 diabetes mellitus with diabetic peripheral angiopathy without gangrene: Secondary | ICD-10-CM | POA: Diagnosis not present

## 2019-05-08 DIAGNOSIS — Z4800 Encounter for change or removal of nonsurgical wound dressing: Secondary | ICD-10-CM | POA: Diagnosis not present

## 2019-05-08 DIAGNOSIS — J449 Chronic obstructive pulmonary disease, unspecified: Secondary | ICD-10-CM | POA: Diagnosis not present

## 2019-05-08 DIAGNOSIS — Z7984 Long term (current) use of oral hypoglycemic drugs: Secondary | ICD-10-CM | POA: Diagnosis not present

## 2019-05-12 DIAGNOSIS — I872 Venous insufficiency (chronic) (peripheral): Secondary | ICD-10-CM | POA: Diagnosis not present

## 2019-05-15 DIAGNOSIS — Z4800 Encounter for change or removal of nonsurgical wound dressing: Secondary | ICD-10-CM | POA: Diagnosis not present

## 2019-05-15 DIAGNOSIS — L97229 Non-pressure chronic ulcer of left calf with unspecified severity: Secondary | ICD-10-CM | POA: Diagnosis not present

## 2019-05-15 DIAGNOSIS — I872 Venous insufficiency (chronic) (peripheral): Secondary | ICD-10-CM | POA: Diagnosis not present

## 2019-05-15 DIAGNOSIS — M545 Low back pain: Secondary | ICD-10-CM | POA: Diagnosis not present

## 2019-05-15 DIAGNOSIS — J449 Chronic obstructive pulmonary disease, unspecified: Secondary | ICD-10-CM | POA: Diagnosis not present

## 2019-05-15 DIAGNOSIS — Z9981 Dependence on supplemental oxygen: Secondary | ICD-10-CM | POA: Diagnosis not present

## 2019-05-15 DIAGNOSIS — E1151 Type 2 diabetes mellitus with diabetic peripheral angiopathy without gangrene: Secondary | ICD-10-CM | POA: Diagnosis not present

## 2019-05-15 DIAGNOSIS — Z7984 Long term (current) use of oral hypoglycemic drugs: Secondary | ICD-10-CM | POA: Diagnosis not present

## 2019-05-18 DIAGNOSIS — J449 Chronic obstructive pulmonary disease, unspecified: Secondary | ICD-10-CM | POA: Diagnosis not present

## 2019-05-18 DIAGNOSIS — E1151 Type 2 diabetes mellitus with diabetic peripheral angiopathy without gangrene: Secondary | ICD-10-CM | POA: Diagnosis not present

## 2019-05-18 DIAGNOSIS — L97229 Non-pressure chronic ulcer of left calf with unspecified severity: Secondary | ICD-10-CM | POA: Diagnosis not present

## 2019-05-18 DIAGNOSIS — M545 Low back pain: Secondary | ICD-10-CM | POA: Diagnosis not present

## 2019-05-18 DIAGNOSIS — Z4800 Encounter for change or removal of nonsurgical wound dressing: Secondary | ICD-10-CM | POA: Diagnosis not present

## 2019-05-18 DIAGNOSIS — Z9981 Dependence on supplemental oxygen: Secondary | ICD-10-CM | POA: Diagnosis not present

## 2019-05-18 DIAGNOSIS — I872 Venous insufficiency (chronic) (peripheral): Secondary | ICD-10-CM | POA: Diagnosis not present

## 2019-05-18 DIAGNOSIS — Z7984 Long term (current) use of oral hypoglycemic drugs: Secondary | ICD-10-CM | POA: Diagnosis not present

## 2019-05-19 DIAGNOSIS — I872 Venous insufficiency (chronic) (peripheral): Secondary | ICD-10-CM | POA: Diagnosis not present

## 2019-05-22 DIAGNOSIS — L97229 Non-pressure chronic ulcer of left calf with unspecified severity: Secondary | ICD-10-CM | POA: Diagnosis not present

## 2019-05-22 DIAGNOSIS — E1151 Type 2 diabetes mellitus with diabetic peripheral angiopathy without gangrene: Secondary | ICD-10-CM | POA: Diagnosis not present

## 2019-05-22 DIAGNOSIS — Z9981 Dependence on supplemental oxygen: Secondary | ICD-10-CM | POA: Diagnosis not present

## 2019-05-22 DIAGNOSIS — I872 Venous insufficiency (chronic) (peripheral): Secondary | ICD-10-CM | POA: Diagnosis not present

## 2019-05-22 DIAGNOSIS — J449 Chronic obstructive pulmonary disease, unspecified: Secondary | ICD-10-CM | POA: Diagnosis not present

## 2019-05-22 DIAGNOSIS — Z7984 Long term (current) use of oral hypoglycemic drugs: Secondary | ICD-10-CM | POA: Diagnosis not present

## 2019-05-22 DIAGNOSIS — M545 Low back pain: Secondary | ICD-10-CM | POA: Diagnosis not present

## 2019-05-22 DIAGNOSIS — Z4800 Encounter for change or removal of nonsurgical wound dressing: Secondary | ICD-10-CM | POA: Diagnosis not present

## 2019-05-26 DIAGNOSIS — I87313 Chronic venous hypertension (idiopathic) with ulcer of bilateral lower extremity: Secondary | ICD-10-CM | POA: Diagnosis not present

## 2019-05-26 DIAGNOSIS — M2550 Pain in unspecified joint: Secondary | ICD-10-CM | POA: Diagnosis not present

## 2019-05-26 DIAGNOSIS — I872 Venous insufficiency (chronic) (peripheral): Secondary | ICD-10-CM | POA: Diagnosis not present

## 2019-05-26 DIAGNOSIS — Z872 Personal history of diseases of the skin and subcutaneous tissue: Secondary | ICD-10-CM | POA: Diagnosis not present

## 2019-05-27 DIAGNOSIS — R05 Cough: Secondary | ICD-10-CM | POA: Diagnosis not present

## 2019-05-27 DIAGNOSIS — R062 Wheezing: Secondary | ICD-10-CM | POA: Diagnosis not present

## 2019-05-27 DIAGNOSIS — J45909 Unspecified asthma, uncomplicated: Secondary | ICD-10-CM | POA: Diagnosis not present

## 2019-05-30 DIAGNOSIS — I872 Venous insufficiency (chronic) (peripheral): Secondary | ICD-10-CM | POA: Diagnosis not present

## 2019-05-30 DIAGNOSIS — Z4800 Encounter for change or removal of nonsurgical wound dressing: Secondary | ICD-10-CM | POA: Diagnosis not present

## 2019-05-30 DIAGNOSIS — Z7984 Long term (current) use of oral hypoglycemic drugs: Secondary | ICD-10-CM | POA: Diagnosis not present

## 2019-05-30 DIAGNOSIS — Z9981 Dependence on supplemental oxygen: Secondary | ICD-10-CM | POA: Diagnosis not present

## 2019-05-30 DIAGNOSIS — J449 Chronic obstructive pulmonary disease, unspecified: Secondary | ICD-10-CM | POA: Diagnosis not present

## 2019-05-30 DIAGNOSIS — M545 Low back pain: Secondary | ICD-10-CM | POA: Diagnosis not present

## 2019-05-30 DIAGNOSIS — L97229 Non-pressure chronic ulcer of left calf with unspecified severity: Secondary | ICD-10-CM | POA: Diagnosis not present

## 2019-05-30 DIAGNOSIS — E1151 Type 2 diabetes mellitus with diabetic peripheral angiopathy without gangrene: Secondary | ICD-10-CM | POA: Diagnosis not present

## 2019-06-02 DIAGNOSIS — J449 Chronic obstructive pulmonary disease, unspecified: Secondary | ICD-10-CM | POA: Diagnosis not present

## 2019-06-07 DIAGNOSIS — Z9981 Dependence on supplemental oxygen: Secondary | ICD-10-CM | POA: Diagnosis not present

## 2019-06-07 DIAGNOSIS — E1151 Type 2 diabetes mellitus with diabetic peripheral angiopathy without gangrene: Secondary | ICD-10-CM | POA: Diagnosis not present

## 2019-06-07 DIAGNOSIS — M545 Low back pain: Secondary | ICD-10-CM | POA: Diagnosis not present

## 2019-06-07 DIAGNOSIS — Z4800 Encounter for change or removal of nonsurgical wound dressing: Secondary | ICD-10-CM | POA: Diagnosis not present

## 2019-06-07 DIAGNOSIS — L97229 Non-pressure chronic ulcer of left calf with unspecified severity: Secondary | ICD-10-CM | POA: Diagnosis not present

## 2019-06-07 DIAGNOSIS — J449 Chronic obstructive pulmonary disease, unspecified: Secondary | ICD-10-CM | POA: Diagnosis not present

## 2019-06-07 DIAGNOSIS — I872 Venous insufficiency (chronic) (peripheral): Secondary | ICD-10-CM | POA: Diagnosis not present

## 2019-06-07 DIAGNOSIS — Z7984 Long term (current) use of oral hypoglycemic drugs: Secondary | ICD-10-CM | POA: Diagnosis not present

## 2019-06-12 DIAGNOSIS — Z9181 History of falling: Secondary | ICD-10-CM | POA: Diagnosis not present

## 2019-06-12 DIAGNOSIS — I509 Heart failure, unspecified: Secondary | ICD-10-CM | POA: Diagnosis not present

## 2019-06-12 DIAGNOSIS — E119 Type 2 diabetes mellitus without complications: Secondary | ICD-10-CM | POA: Diagnosis not present

## 2019-06-12 DIAGNOSIS — I4891 Unspecified atrial fibrillation: Secondary | ICD-10-CM | POA: Diagnosis not present

## 2019-06-12 DIAGNOSIS — I110 Hypertensive heart disease with heart failure: Secondary | ICD-10-CM | POA: Diagnosis not present

## 2019-06-12 DIAGNOSIS — E785 Hyperlipidemia, unspecified: Secondary | ICD-10-CM | POA: Diagnosis not present

## 2019-06-12 DIAGNOSIS — G8929 Other chronic pain: Secondary | ICD-10-CM | POA: Diagnosis not present

## 2019-06-12 DIAGNOSIS — Z7984 Long term (current) use of oral hypoglycemic drugs: Secondary | ICD-10-CM | POA: Diagnosis not present

## 2019-06-12 DIAGNOSIS — J449 Chronic obstructive pulmonary disease, unspecified: Secondary | ICD-10-CM | POA: Diagnosis not present

## 2019-06-12 DIAGNOSIS — M545 Low back pain: Secondary | ICD-10-CM | POA: Diagnosis not present

## 2019-06-12 DIAGNOSIS — E039 Hypothyroidism, unspecified: Secondary | ICD-10-CM | POA: Diagnosis not present

## 2019-06-15 DIAGNOSIS — I4891 Unspecified atrial fibrillation: Secondary | ICD-10-CM | POA: Diagnosis not present

## 2019-06-15 DIAGNOSIS — I110 Hypertensive heart disease with heart failure: Secondary | ICD-10-CM | POA: Diagnosis not present

## 2019-06-15 DIAGNOSIS — I509 Heart failure, unspecified: Secondary | ICD-10-CM | POA: Diagnosis not present

## 2019-06-15 DIAGNOSIS — E119 Type 2 diabetes mellitus without complications: Secondary | ICD-10-CM | POA: Diagnosis not present

## 2019-06-15 DIAGNOSIS — J449 Chronic obstructive pulmonary disease, unspecified: Secondary | ICD-10-CM | POA: Diagnosis not present

## 2019-06-26 DIAGNOSIS — J45909 Unspecified asthma, uncomplicated: Secondary | ICD-10-CM | POA: Diagnosis not present

## 2019-06-26 DIAGNOSIS — R05 Cough: Secondary | ICD-10-CM | POA: Diagnosis not present

## 2019-06-26 DIAGNOSIS — R062 Wheezing: Secondary | ICD-10-CM | POA: Diagnosis not present

## 2019-07-02 DIAGNOSIS — J449 Chronic obstructive pulmonary disease, unspecified: Secondary | ICD-10-CM | POA: Diagnosis not present

## 2019-07-07 DIAGNOSIS — I10 Essential (primary) hypertension: Secondary | ICD-10-CM | POA: Diagnosis not present

## 2019-07-07 DIAGNOSIS — R609 Edema, unspecified: Secondary | ICD-10-CM | POA: Diagnosis not present

## 2019-07-07 DIAGNOSIS — Z880 Allergy status to penicillin: Secondary | ICD-10-CM | POA: Diagnosis not present

## 2019-07-07 DIAGNOSIS — M1712 Unilateral primary osteoarthritis, left knee: Secondary | ICD-10-CM | POA: Diagnosis not present

## 2019-07-07 DIAGNOSIS — J45909 Unspecified asthma, uncomplicated: Secondary | ICD-10-CM | POA: Diagnosis not present

## 2019-07-07 DIAGNOSIS — I509 Heart failure, unspecified: Secondary | ICD-10-CM | POA: Diagnosis not present

## 2019-07-07 DIAGNOSIS — R262 Difficulty in walking, not elsewhere classified: Secondary | ICD-10-CM | POA: Diagnosis not present

## 2019-07-07 DIAGNOSIS — E7800 Pure hypercholesterolemia, unspecified: Secondary | ICD-10-CM | POA: Diagnosis not present

## 2019-07-07 DIAGNOSIS — M7989 Other specified soft tissue disorders: Secondary | ICD-10-CM | POA: Diagnosis not present

## 2019-07-07 DIAGNOSIS — Z87891 Personal history of nicotine dependence: Secondary | ICD-10-CM | POA: Diagnosis not present

## 2019-07-07 DIAGNOSIS — R600 Localized edema: Secondary | ICD-10-CM | POA: Diagnosis not present

## 2019-07-07 DIAGNOSIS — Z7984 Long term (current) use of oral hypoglycemic drugs: Secondary | ICD-10-CM | POA: Diagnosis not present

## 2019-07-07 DIAGNOSIS — Z743 Need for continuous supervision: Secondary | ICD-10-CM | POA: Diagnosis not present

## 2019-07-07 DIAGNOSIS — J449 Chronic obstructive pulmonary disease, unspecified: Secondary | ICD-10-CM | POA: Diagnosis not present

## 2019-07-07 DIAGNOSIS — Z79899 Other long term (current) drug therapy: Secondary | ICD-10-CM | POA: Diagnosis not present

## 2019-07-10 DIAGNOSIS — R600 Localized edema: Secondary | ICD-10-CM | POA: Diagnosis not present

## 2019-07-26 DIAGNOSIS — J449 Chronic obstructive pulmonary disease, unspecified: Secondary | ICD-10-CM | POA: Diagnosis not present

## 2019-07-27 DIAGNOSIS — R062 Wheezing: Secondary | ICD-10-CM | POA: Diagnosis not present

## 2019-07-27 DIAGNOSIS — R05 Cough: Secondary | ICD-10-CM | POA: Diagnosis not present

## 2019-07-27 DIAGNOSIS — J45909 Unspecified asthma, uncomplicated: Secondary | ICD-10-CM | POA: Diagnosis not present

## 2019-08-02 DIAGNOSIS — E1121 Type 2 diabetes mellitus with diabetic nephropathy: Secondary | ICD-10-CM | POA: Diagnosis not present

## 2019-08-02 DIAGNOSIS — Z0000 Encounter for general adult medical examination without abnormal findings: Secondary | ICD-10-CM | POA: Diagnosis not present

## 2019-08-02 DIAGNOSIS — N182 Chronic kidney disease, stage 2 (mild): Secondary | ICD-10-CM | POA: Diagnosis not present

## 2019-08-02 DIAGNOSIS — Z1389 Encounter for screening for other disorder: Secondary | ICD-10-CM | POA: Diagnosis not present

## 2019-08-02 DIAGNOSIS — J449 Chronic obstructive pulmonary disease, unspecified: Secondary | ICD-10-CM | POA: Diagnosis not present

## 2019-08-02 DIAGNOSIS — E782 Mixed hyperlipidemia: Secondary | ICD-10-CM | POA: Diagnosis not present

## 2019-08-02 DIAGNOSIS — I872 Venous insufficiency (chronic) (peripheral): Secondary | ICD-10-CM | POA: Diagnosis not present

## 2019-08-02 DIAGNOSIS — I10 Essential (primary) hypertension: Secondary | ICD-10-CM | POA: Diagnosis not present

## 2019-08-10 DIAGNOSIS — Z9981 Dependence on supplemental oxygen: Secondary | ICD-10-CM | POA: Diagnosis not present

## 2019-08-10 DIAGNOSIS — E119 Type 2 diabetes mellitus without complications: Secondary | ICD-10-CM | POA: Diagnosis not present

## 2019-08-10 DIAGNOSIS — I509 Heart failure, unspecified: Secondary | ICD-10-CM | POA: Diagnosis not present

## 2019-08-10 DIAGNOSIS — G8929 Other chronic pain: Secondary | ICD-10-CM | POA: Diagnosis not present

## 2019-08-10 DIAGNOSIS — M545 Low back pain: Secondary | ICD-10-CM | POA: Diagnosis not present

## 2019-08-10 DIAGNOSIS — Z7984 Long term (current) use of oral hypoglycemic drugs: Secondary | ICD-10-CM | POA: Diagnosis not present

## 2019-08-10 DIAGNOSIS — I4891 Unspecified atrial fibrillation: Secondary | ICD-10-CM | POA: Diagnosis not present

## 2019-08-10 DIAGNOSIS — I110 Hypertensive heart disease with heart failure: Secondary | ICD-10-CM | POA: Diagnosis not present

## 2019-08-10 DIAGNOSIS — E785 Hyperlipidemia, unspecified: Secondary | ICD-10-CM | POA: Diagnosis not present

## 2019-08-10 DIAGNOSIS — Z9181 History of falling: Secondary | ICD-10-CM | POA: Diagnosis not present

## 2019-08-10 DIAGNOSIS — E039 Hypothyroidism, unspecified: Secondary | ICD-10-CM | POA: Diagnosis not present

## 2019-08-10 DIAGNOSIS — J449 Chronic obstructive pulmonary disease, unspecified: Secondary | ICD-10-CM | POA: Diagnosis not present

## 2019-08-14 DIAGNOSIS — I4891 Unspecified atrial fibrillation: Secondary | ICD-10-CM | POA: Diagnosis not present

## 2019-08-14 DIAGNOSIS — I110 Hypertensive heart disease with heart failure: Secondary | ICD-10-CM | POA: Diagnosis not present

## 2019-08-14 DIAGNOSIS — J449 Chronic obstructive pulmonary disease, unspecified: Secondary | ICD-10-CM | POA: Diagnosis not present

## 2019-08-14 DIAGNOSIS — E119 Type 2 diabetes mellitus without complications: Secondary | ICD-10-CM | POA: Diagnosis not present

## 2019-08-14 DIAGNOSIS — I509 Heart failure, unspecified: Secondary | ICD-10-CM | POA: Diagnosis not present

## 2019-08-27 DIAGNOSIS — R05 Cough: Secondary | ICD-10-CM | POA: Diagnosis not present

## 2019-08-27 DIAGNOSIS — R062 Wheezing: Secondary | ICD-10-CM | POA: Diagnosis not present

## 2019-08-27 DIAGNOSIS — J45909 Unspecified asthma, uncomplicated: Secondary | ICD-10-CM | POA: Diagnosis not present

## 2019-09-02 DIAGNOSIS — J449 Chronic obstructive pulmonary disease, unspecified: Secondary | ICD-10-CM | POA: Diagnosis not present

## 2019-09-30 DIAGNOSIS — J449 Chronic obstructive pulmonary disease, unspecified: Secondary | ICD-10-CM | POA: Diagnosis not present

## 2019-10-09 DIAGNOSIS — Z1231 Encounter for screening mammogram for malignant neoplasm of breast: Secondary | ICD-10-CM | POA: Diagnosis not present

## 2019-10-11 DIAGNOSIS — E119 Type 2 diabetes mellitus without complications: Secondary | ICD-10-CM | POA: Diagnosis not present

## 2019-10-11 DIAGNOSIS — Z7984 Long term (current) use of oral hypoglycemic drugs: Secondary | ICD-10-CM | POA: Diagnosis not present

## 2019-10-11 DIAGNOSIS — Z9981 Dependence on supplemental oxygen: Secondary | ICD-10-CM | POA: Diagnosis not present

## 2019-10-11 DIAGNOSIS — I4891 Unspecified atrial fibrillation: Secondary | ICD-10-CM | POA: Diagnosis not present

## 2019-10-11 DIAGNOSIS — I110 Hypertensive heart disease with heart failure: Secondary | ICD-10-CM | POA: Diagnosis not present

## 2019-10-11 DIAGNOSIS — M545 Low back pain: Secondary | ICD-10-CM | POA: Diagnosis not present

## 2019-10-11 DIAGNOSIS — I509 Heart failure, unspecified: Secondary | ICD-10-CM | POA: Diagnosis not present

## 2019-10-11 DIAGNOSIS — G8929 Other chronic pain: Secondary | ICD-10-CM | POA: Diagnosis not present

## 2019-10-11 DIAGNOSIS — E785 Hyperlipidemia, unspecified: Secondary | ICD-10-CM | POA: Diagnosis not present

## 2019-10-11 DIAGNOSIS — J449 Chronic obstructive pulmonary disease, unspecified: Secondary | ICD-10-CM | POA: Diagnosis not present

## 2019-10-11 DIAGNOSIS — Z9181 History of falling: Secondary | ICD-10-CM | POA: Diagnosis not present

## 2019-10-11 DIAGNOSIS — E039 Hypothyroidism, unspecified: Secondary | ICD-10-CM | POA: Diagnosis not present

## 2019-10-12 DIAGNOSIS — R062 Wheezing: Secondary | ICD-10-CM | POA: Diagnosis not present

## 2019-10-12 DIAGNOSIS — R05 Cough: Secondary | ICD-10-CM | POA: Diagnosis not present

## 2019-10-12 DIAGNOSIS — J45909 Unspecified asthma, uncomplicated: Secondary | ICD-10-CM | POA: Diagnosis not present

## 2019-10-13 DIAGNOSIS — I4891 Unspecified atrial fibrillation: Secondary | ICD-10-CM | POA: Diagnosis not present

## 2019-10-13 DIAGNOSIS — E119 Type 2 diabetes mellitus without complications: Secondary | ICD-10-CM | POA: Diagnosis not present

## 2019-10-13 DIAGNOSIS — J449 Chronic obstructive pulmonary disease, unspecified: Secondary | ICD-10-CM | POA: Diagnosis not present

## 2019-10-13 DIAGNOSIS — I509 Heart failure, unspecified: Secondary | ICD-10-CM | POA: Diagnosis not present

## 2019-10-13 DIAGNOSIS — I110 Hypertensive heart disease with heart failure: Secondary | ICD-10-CM | POA: Diagnosis not present

## 2019-10-31 DIAGNOSIS — J449 Chronic obstructive pulmonary disease, unspecified: Secondary | ICD-10-CM | POA: Diagnosis not present

## 2019-11-02 DIAGNOSIS — J449 Chronic obstructive pulmonary disease, unspecified: Secondary | ICD-10-CM | POA: Diagnosis not present

## 2019-11-02 DIAGNOSIS — M545 Low back pain: Secondary | ICD-10-CM | POA: Diagnosis not present

## 2019-11-02 DIAGNOSIS — E1121 Type 2 diabetes mellitus with diabetic nephropathy: Secondary | ICD-10-CM | POA: Diagnosis not present

## 2019-11-02 DIAGNOSIS — N182 Chronic kidney disease, stage 2 (mild): Secondary | ICD-10-CM | POA: Diagnosis not present

## 2019-11-02 DIAGNOSIS — I10 Essential (primary) hypertension: Secondary | ICD-10-CM | POA: Diagnosis not present

## 2019-11-02 DIAGNOSIS — E782 Mixed hyperlipidemia: Secondary | ICD-10-CM | POA: Diagnosis not present

## 2019-11-06 ENCOUNTER — Other Ambulatory Visit (HOSPITAL_COMMUNITY): Payer: Self-pay | Admitting: Respiratory Therapy

## 2019-11-06 DIAGNOSIS — J449 Chronic obstructive pulmonary disease, unspecified: Secondary | ICD-10-CM

## 2019-11-24 ENCOUNTER — Other Ambulatory Visit (HOSPITAL_COMMUNITY)
Admission: RE | Admit: 2019-11-24 | Discharge: 2019-11-24 | Disposition: A | Discharge status: Home / Self Care | Payer: Medicare Other | Source: Ambulatory Visit | Attending: Internal Medicine | Admitting: Internal Medicine

## 2019-11-24 ENCOUNTER — Other Ambulatory Visit: Payer: Self-pay

## 2019-11-24 DIAGNOSIS — Z20822 Contact with and (suspected) exposure to covid-19: Secondary | ICD-10-CM | POA: Insufficient documentation

## 2019-11-24 DIAGNOSIS — R05 Cough: Secondary | ICD-10-CM | POA: Diagnosis not present

## 2019-11-24 DIAGNOSIS — Z01812 Encounter for preprocedural laboratory examination: Secondary | ICD-10-CM | POA: Diagnosis not present

## 2019-11-24 DIAGNOSIS — R062 Wheezing: Secondary | ICD-10-CM | POA: Diagnosis not present

## 2019-11-24 DIAGNOSIS — J45909 Unspecified asthma, uncomplicated: Secondary | ICD-10-CM | POA: Diagnosis not present

## 2019-11-25 LAB — SARS CORONAVIRUS 2 (TAT 6-24 HRS): SARS Coronavirus 2: NEGATIVE

## 2019-11-28 ENCOUNTER — Other Ambulatory Visit: Payer: Self-pay

## 2019-11-28 ENCOUNTER — Ambulatory Visit (HOSPITAL_COMMUNITY)
Admission: RE | Admit: 2019-11-28 | Discharge: 2019-11-28 | Disposition: A | Discharge status: Home / Self Care | Payer: Medicare Other | Source: Ambulatory Visit | Attending: Internal Medicine | Admitting: Internal Medicine

## 2019-11-28 DIAGNOSIS — J449 Chronic obstructive pulmonary disease, unspecified: Secondary | ICD-10-CM | POA: Insufficient documentation

## 2019-11-28 LAB — PULMONARY FUNCTION TEST
FEF 25-75 Post: 0.55 L/sec
FEF 25-75 Pre: 0.92 L/sec
FEF2575-%Change-Post: -40 %
FEF2575-%Pred-Post: 31 %
FEF2575-%Pred-Pre: 52 %
FEV1-%Change-Post: -15 %
FEV1-%Pred-Post: 33 %
FEV1-%Pred-Pre: 39 %
FEV1-Post: 0.68 L
FEV1-Pre: 0.81 L
FEV1FVC-%Change-Post: -18 %
FEV1FVC-%Pred-Pre: 118 %
FEV6-%Change-Post: 9 %
FEV6-%Pred-Post: 36 %
FEV6-%Pred-Pre: 33 %
FEV6-Post: 0.93 L
FEV6-Pre: 0.85 L
FEV6FVC-%Pred-Post: 104 %
FEV6FVC-%Pred-Pre: 104 %
FVC-%Change-Post: 3 %
FVC-%Pred-Post: 34 %
FVC-%Pred-Pre: 33 %
FVC-Post: 0.93 L
FVC-Pre: 0.9 L
Post FEV1/FVC ratio: 73 %
Post FEV6/FVC ratio: 100 %
Pre FEV1/FVC ratio: 90 %
Pre FEV6/FVC Ratio: 100 %

## 2019-11-28 MED ORDER — ALBUTEROL SULFATE (2.5 MG/3ML) 0.083% IN NEBU
2.5000 mg | INHALATION_SOLUTION | Freq: Once | RESPIRATORY_TRACT | Status: AC
  Administered 2019-11-28: 2.5 mg via RESPIRATORY_TRACT

## 2019-11-30 DIAGNOSIS — J449 Chronic obstructive pulmonary disease, unspecified: Secondary | ICD-10-CM | POA: Diagnosis not present

## 2019-12-07 DIAGNOSIS — I110 Hypertensive heart disease with heart failure: Secondary | ICD-10-CM | POA: Diagnosis not present

## 2019-12-07 DIAGNOSIS — E039 Hypothyroidism, unspecified: Secondary | ICD-10-CM | POA: Diagnosis not present

## 2019-12-07 DIAGNOSIS — Z9981 Dependence on supplemental oxygen: Secondary | ICD-10-CM | POA: Diagnosis not present

## 2019-12-07 DIAGNOSIS — G8929 Other chronic pain: Secondary | ICD-10-CM | POA: Diagnosis not present

## 2019-12-07 DIAGNOSIS — J449 Chronic obstructive pulmonary disease, unspecified: Secondary | ICD-10-CM | POA: Diagnosis not present

## 2019-12-07 DIAGNOSIS — Z9181 History of falling: Secondary | ICD-10-CM | POA: Diagnosis not present

## 2019-12-07 DIAGNOSIS — Z7984 Long term (current) use of oral hypoglycemic drugs: Secondary | ICD-10-CM | POA: Diagnosis not present

## 2019-12-07 DIAGNOSIS — M545 Low back pain: Secondary | ICD-10-CM | POA: Diagnosis not present

## 2019-12-07 DIAGNOSIS — I4891 Unspecified atrial fibrillation: Secondary | ICD-10-CM | POA: Diagnosis not present

## 2019-12-07 DIAGNOSIS — E119 Type 2 diabetes mellitus without complications: Secondary | ICD-10-CM | POA: Diagnosis not present

## 2019-12-07 DIAGNOSIS — E785 Hyperlipidemia, unspecified: Secondary | ICD-10-CM | POA: Diagnosis not present

## 2019-12-07 DIAGNOSIS — I509 Heart failure, unspecified: Secondary | ICD-10-CM | POA: Diagnosis not present

## 2019-12-12 DIAGNOSIS — I110 Hypertensive heart disease with heart failure: Secondary | ICD-10-CM | POA: Diagnosis not present

## 2019-12-12 DIAGNOSIS — J449 Chronic obstructive pulmonary disease, unspecified: Secondary | ICD-10-CM | POA: Diagnosis not present

## 2019-12-12 DIAGNOSIS — E119 Type 2 diabetes mellitus without complications: Secondary | ICD-10-CM | POA: Diagnosis not present

## 2019-12-12 DIAGNOSIS — I509 Heart failure, unspecified: Secondary | ICD-10-CM | POA: Diagnosis not present

## 2019-12-18 DIAGNOSIS — M179 Osteoarthritis of knee, unspecified: Secondary | ICD-10-CM | POA: Diagnosis not present

## 2019-12-18 DIAGNOSIS — M160 Bilateral primary osteoarthritis of hip: Secondary | ICD-10-CM | POA: Diagnosis not present

## 2019-12-25 DIAGNOSIS — R05 Cough: Secondary | ICD-10-CM | POA: Diagnosis not present

## 2019-12-25 DIAGNOSIS — R062 Wheezing: Secondary | ICD-10-CM | POA: Diagnosis not present

## 2019-12-25 DIAGNOSIS — J45909 Unspecified asthma, uncomplicated: Secondary | ICD-10-CM | POA: Diagnosis not present

## 2019-12-31 DIAGNOSIS — J449 Chronic obstructive pulmonary disease, unspecified: Secondary | ICD-10-CM | POA: Diagnosis not present

## 2020-01-24 DIAGNOSIS — J45909 Unspecified asthma, uncomplicated: Secondary | ICD-10-CM | POA: Diagnosis not present

## 2020-01-24 DIAGNOSIS — R062 Wheezing: Secondary | ICD-10-CM | POA: Diagnosis not present

## 2020-01-24 DIAGNOSIS — R05 Cough: Secondary | ICD-10-CM | POA: Diagnosis not present

## 2020-02-05 DIAGNOSIS — Z9981 Dependence on supplemental oxygen: Secondary | ICD-10-CM | POA: Diagnosis not present

## 2020-02-05 DIAGNOSIS — M545 Low back pain: Secondary | ICD-10-CM | POA: Diagnosis not present

## 2020-02-05 DIAGNOSIS — I4891 Unspecified atrial fibrillation: Secondary | ICD-10-CM | POA: Diagnosis not present

## 2020-02-05 DIAGNOSIS — G8929 Other chronic pain: Secondary | ICD-10-CM | POA: Diagnosis not present

## 2020-02-05 DIAGNOSIS — Z9181 History of falling: Secondary | ICD-10-CM | POA: Diagnosis not present

## 2020-02-05 DIAGNOSIS — E119 Type 2 diabetes mellitus without complications: Secondary | ICD-10-CM | POA: Diagnosis not present

## 2020-02-05 DIAGNOSIS — I110 Hypertensive heart disease with heart failure: Secondary | ICD-10-CM | POA: Diagnosis not present

## 2020-02-05 DIAGNOSIS — Z7984 Long term (current) use of oral hypoglycemic drugs: Secondary | ICD-10-CM | POA: Diagnosis not present

## 2020-02-05 DIAGNOSIS — E039 Hypothyroidism, unspecified: Secondary | ICD-10-CM | POA: Diagnosis not present

## 2020-02-05 DIAGNOSIS — J449 Chronic obstructive pulmonary disease, unspecified: Secondary | ICD-10-CM | POA: Diagnosis not present

## 2020-02-05 DIAGNOSIS — E785 Hyperlipidemia, unspecified: Secondary | ICD-10-CM | POA: Diagnosis not present

## 2020-02-05 DIAGNOSIS — I509 Heart failure, unspecified: Secondary | ICD-10-CM | POA: Diagnosis not present

## 2020-02-10 DIAGNOSIS — I110 Hypertensive heart disease with heart failure: Secondary | ICD-10-CM | POA: Diagnosis not present

## 2020-02-10 DIAGNOSIS — E119 Type 2 diabetes mellitus without complications: Secondary | ICD-10-CM | POA: Diagnosis not present

## 2020-02-10 DIAGNOSIS — J449 Chronic obstructive pulmonary disease, unspecified: Secondary | ICD-10-CM | POA: Diagnosis not present

## 2020-02-10 DIAGNOSIS — I509 Heart failure, unspecified: Secondary | ICD-10-CM | POA: Diagnosis not present

## 2020-02-24 DIAGNOSIS — R062 Wheezing: Secondary | ICD-10-CM | POA: Diagnosis not present

## 2020-02-24 DIAGNOSIS — J45909 Unspecified asthma, uncomplicated: Secondary | ICD-10-CM | POA: Diagnosis not present

## 2020-02-24 DIAGNOSIS — R05 Cough: Secondary | ICD-10-CM | POA: Diagnosis not present

## 2020-03-21 DIAGNOSIS — J449 Chronic obstructive pulmonary disease, unspecified: Secondary | ICD-10-CM | POA: Diagnosis not present

## 2020-03-21 DIAGNOSIS — E782 Mixed hyperlipidemia: Secondary | ICD-10-CM | POA: Diagnosis not present

## 2020-03-21 DIAGNOSIS — M179 Osteoarthritis of knee, unspecified: Secondary | ICD-10-CM | POA: Diagnosis not present

## 2020-03-21 DIAGNOSIS — E1121 Type 2 diabetes mellitus with diabetic nephropathy: Secondary | ICD-10-CM | POA: Diagnosis not present

## 2020-03-21 DIAGNOSIS — M160 Bilateral primary osteoarthritis of hip: Secondary | ICD-10-CM | POA: Diagnosis not present

## 2020-03-21 DIAGNOSIS — N182 Chronic kidney disease, stage 2 (mild): Secondary | ICD-10-CM | POA: Diagnosis not present

## 2020-03-21 DIAGNOSIS — M545 Low back pain: Secondary | ICD-10-CM | POA: Diagnosis not present

## 2020-03-21 DIAGNOSIS — I10 Essential (primary) hypertension: Secondary | ICD-10-CM | POA: Diagnosis not present

## 2020-03-21 DIAGNOSIS — E7849 Other hyperlipidemia: Secondary | ICD-10-CM | POA: Diagnosis not present

## 2020-03-26 DIAGNOSIS — R05 Cough: Secondary | ICD-10-CM | POA: Diagnosis not present

## 2020-03-26 DIAGNOSIS — J45909 Unspecified asthma, uncomplicated: Secondary | ICD-10-CM | POA: Diagnosis not present

## 2020-03-26 DIAGNOSIS — R062 Wheezing: Secondary | ICD-10-CM | POA: Diagnosis not present

## 2020-03-28 ENCOUNTER — Encounter (INDEPENDENT_AMBULATORY_CARE_PROVIDER_SITE_OTHER): Payer: Self-pay | Admitting: *Deleted

## 2020-04-08 DIAGNOSIS — M545 Low back pain: Secondary | ICD-10-CM | POA: Diagnosis not present

## 2020-04-08 DIAGNOSIS — Z7983 Long term (current) use of bisphosphonates: Secondary | ICD-10-CM | POA: Diagnosis not present

## 2020-04-08 DIAGNOSIS — J449 Chronic obstructive pulmonary disease, unspecified: Secondary | ICD-10-CM | POA: Diagnosis not present

## 2020-04-08 DIAGNOSIS — I509 Heart failure, unspecified: Secondary | ICD-10-CM | POA: Diagnosis not present

## 2020-04-08 DIAGNOSIS — I4891 Unspecified atrial fibrillation: Secondary | ICD-10-CM | POA: Diagnosis not present

## 2020-04-08 DIAGNOSIS — Z9181 History of falling: Secondary | ICD-10-CM | POA: Diagnosis not present

## 2020-04-08 DIAGNOSIS — E785 Hyperlipidemia, unspecified: Secondary | ICD-10-CM | POA: Diagnosis not present

## 2020-04-08 DIAGNOSIS — G8929 Other chronic pain: Secondary | ICD-10-CM | POA: Diagnosis not present

## 2020-04-08 DIAGNOSIS — Z7984 Long term (current) use of oral hypoglycemic drugs: Secondary | ICD-10-CM | POA: Diagnosis not present

## 2020-04-08 DIAGNOSIS — Z9981 Dependence on supplemental oxygen: Secondary | ICD-10-CM | POA: Diagnosis not present

## 2020-04-08 DIAGNOSIS — E119 Type 2 diabetes mellitus without complications: Secondary | ICD-10-CM | POA: Diagnosis not present

## 2020-04-08 DIAGNOSIS — E039 Hypothyroidism, unspecified: Secondary | ICD-10-CM | POA: Diagnosis not present

## 2020-04-08 DIAGNOSIS — I110 Hypertensive heart disease with heart failure: Secondary | ICD-10-CM | POA: Diagnosis not present

## 2020-04-10 DIAGNOSIS — I4891 Unspecified atrial fibrillation: Secondary | ICD-10-CM | POA: Diagnosis not present

## 2020-04-10 DIAGNOSIS — E119 Type 2 diabetes mellitus without complications: Secondary | ICD-10-CM | POA: Diagnosis not present

## 2020-04-10 DIAGNOSIS — I509 Heart failure, unspecified: Secondary | ICD-10-CM | POA: Diagnosis not present

## 2020-04-10 DIAGNOSIS — J449 Chronic obstructive pulmonary disease, unspecified: Secondary | ICD-10-CM | POA: Diagnosis not present

## 2020-04-10 DIAGNOSIS — E039 Hypothyroidism, unspecified: Secondary | ICD-10-CM | POA: Diagnosis not present

## 2020-04-25 DIAGNOSIS — R059 Cough, unspecified: Secondary | ICD-10-CM | POA: Diagnosis not present

## 2020-04-25 DIAGNOSIS — J45909 Unspecified asthma, uncomplicated: Secondary | ICD-10-CM | POA: Diagnosis not present

## 2020-04-25 DIAGNOSIS — R062 Wheezing: Secondary | ICD-10-CM | POA: Diagnosis not present

## 2020-05-08 DIAGNOSIS — J449 Chronic obstructive pulmonary disease, unspecified: Secondary | ICD-10-CM | POA: Diagnosis not present

## 2020-05-26 DIAGNOSIS — J45909 Unspecified asthma, uncomplicated: Secondary | ICD-10-CM | POA: Diagnosis not present

## 2020-05-26 DIAGNOSIS — R062 Wheezing: Secondary | ICD-10-CM | POA: Diagnosis not present

## 2020-05-26 DIAGNOSIS — R059 Cough, unspecified: Secondary | ICD-10-CM | POA: Diagnosis not present

## 2020-06-05 DIAGNOSIS — G8929 Other chronic pain: Secondary | ICD-10-CM | POA: Diagnosis not present

## 2020-06-05 DIAGNOSIS — Z9181 History of falling: Secondary | ICD-10-CM | POA: Diagnosis not present

## 2020-06-05 DIAGNOSIS — Z7984 Long term (current) use of oral hypoglycemic drugs: Secondary | ICD-10-CM | POA: Diagnosis not present

## 2020-06-05 DIAGNOSIS — E785 Hyperlipidemia, unspecified: Secondary | ICD-10-CM | POA: Diagnosis not present

## 2020-06-05 DIAGNOSIS — E039 Hypothyroidism, unspecified: Secondary | ICD-10-CM | POA: Diagnosis not present

## 2020-06-05 DIAGNOSIS — J449 Chronic obstructive pulmonary disease, unspecified: Secondary | ICD-10-CM | POA: Diagnosis not present

## 2020-06-05 DIAGNOSIS — E119 Type 2 diabetes mellitus without complications: Secondary | ICD-10-CM | POA: Diagnosis not present

## 2020-06-05 DIAGNOSIS — M545 Low back pain, unspecified: Secondary | ICD-10-CM | POA: Diagnosis not present

## 2020-06-05 DIAGNOSIS — I509 Heart failure, unspecified: Secondary | ICD-10-CM | POA: Diagnosis not present

## 2020-06-05 DIAGNOSIS — Z7983 Long term (current) use of bisphosphonates: Secondary | ICD-10-CM | POA: Diagnosis not present

## 2020-06-05 DIAGNOSIS — I110 Hypertensive heart disease with heart failure: Secondary | ICD-10-CM | POA: Diagnosis not present

## 2020-06-05 DIAGNOSIS — I4891 Unspecified atrial fibrillation: Secondary | ICD-10-CM | POA: Diagnosis not present

## 2020-06-05 DIAGNOSIS — Z9981 Dependence on supplemental oxygen: Secondary | ICD-10-CM | POA: Diagnosis not present

## 2020-06-09 DIAGNOSIS — I4891 Unspecified atrial fibrillation: Secondary | ICD-10-CM | POA: Diagnosis not present

## 2020-06-09 DIAGNOSIS — E039 Hypothyroidism, unspecified: Secondary | ICD-10-CM | POA: Diagnosis not present

## 2020-06-09 DIAGNOSIS — J449 Chronic obstructive pulmonary disease, unspecified: Secondary | ICD-10-CM | POA: Diagnosis not present

## 2020-06-09 DIAGNOSIS — I509 Heart failure, unspecified: Secondary | ICD-10-CM | POA: Diagnosis not present

## 2020-06-09 DIAGNOSIS — E119 Type 2 diabetes mellitus without complications: Secondary | ICD-10-CM | POA: Diagnosis not present

## 2020-06-17 DIAGNOSIS — N182 Chronic kidney disease, stage 2 (mild): Secondary | ICD-10-CM | POA: Diagnosis not present

## 2020-06-17 DIAGNOSIS — I10 Essential (primary) hypertension: Secondary | ICD-10-CM | POA: Diagnosis not present

## 2020-06-17 DIAGNOSIS — Z0000 Encounter for general adult medical examination without abnormal findings: Secondary | ICD-10-CM | POA: Diagnosis not present

## 2020-06-17 DIAGNOSIS — M160 Bilateral primary osteoarthritis of hip: Secondary | ICD-10-CM | POA: Diagnosis not present

## 2020-06-17 DIAGNOSIS — J449 Chronic obstructive pulmonary disease, unspecified: Secondary | ICD-10-CM | POA: Diagnosis not present

## 2020-06-17 DIAGNOSIS — E1121 Type 2 diabetes mellitus with diabetic nephropathy: Secondary | ICD-10-CM | POA: Diagnosis not present

## 2020-06-17 DIAGNOSIS — M179 Osteoarthritis of knee, unspecified: Secondary | ICD-10-CM | POA: Diagnosis not present

## 2020-06-17 DIAGNOSIS — E7849 Other hyperlipidemia: Secondary | ICD-10-CM | POA: Diagnosis not present

## 2020-06-25 DIAGNOSIS — J45909 Unspecified asthma, uncomplicated: Secondary | ICD-10-CM | POA: Diagnosis not present

## 2020-06-25 DIAGNOSIS — R059 Cough, unspecified: Secondary | ICD-10-CM | POA: Diagnosis not present

## 2020-06-25 DIAGNOSIS — R062 Wheezing: Secondary | ICD-10-CM | POA: Diagnosis not present

## 2020-07-11 DIAGNOSIS — H524 Presbyopia: Secondary | ICD-10-CM | POA: Diagnosis not present

## 2020-07-11 DIAGNOSIS — Z961 Presence of intraocular lens: Secondary | ICD-10-CM | POA: Diagnosis not present

## 2020-07-11 DIAGNOSIS — Z7984 Long term (current) use of oral hypoglycemic drugs: Secondary | ICD-10-CM | POA: Diagnosis not present

## 2020-07-11 DIAGNOSIS — E119 Type 2 diabetes mellitus without complications: Secondary | ICD-10-CM | POA: Diagnosis not present

## 2020-07-26 DIAGNOSIS — J45909 Unspecified asthma, uncomplicated: Secondary | ICD-10-CM | POA: Diagnosis not present

## 2020-07-26 DIAGNOSIS — R059 Cough, unspecified: Secondary | ICD-10-CM | POA: Diagnosis not present

## 2020-07-26 DIAGNOSIS — R062 Wheezing: Secondary | ICD-10-CM | POA: Diagnosis not present

## 2020-08-06 DIAGNOSIS — Z9181 History of falling: Secondary | ICD-10-CM | POA: Diagnosis not present

## 2020-08-06 DIAGNOSIS — I509 Heart failure, unspecified: Secondary | ICD-10-CM | POA: Diagnosis not present

## 2020-08-06 DIAGNOSIS — I4891 Unspecified atrial fibrillation: Secondary | ICD-10-CM | POA: Diagnosis not present

## 2020-08-06 DIAGNOSIS — E785 Hyperlipidemia, unspecified: Secondary | ICD-10-CM | POA: Diagnosis not present

## 2020-08-06 DIAGNOSIS — Z7984 Long term (current) use of oral hypoglycemic drugs: Secondary | ICD-10-CM | POA: Diagnosis not present

## 2020-08-06 DIAGNOSIS — G8929 Other chronic pain: Secondary | ICD-10-CM | POA: Diagnosis not present

## 2020-08-06 DIAGNOSIS — M545 Low back pain, unspecified: Secondary | ICD-10-CM | POA: Diagnosis not present

## 2020-08-06 DIAGNOSIS — J449 Chronic obstructive pulmonary disease, unspecified: Secondary | ICD-10-CM | POA: Diagnosis not present

## 2020-08-06 DIAGNOSIS — Z7983 Long term (current) use of bisphosphonates: Secondary | ICD-10-CM | POA: Diagnosis not present

## 2020-08-06 DIAGNOSIS — E039 Hypothyroidism, unspecified: Secondary | ICD-10-CM | POA: Diagnosis not present

## 2020-08-06 DIAGNOSIS — I110 Hypertensive heart disease with heart failure: Secondary | ICD-10-CM | POA: Diagnosis not present

## 2020-08-06 DIAGNOSIS — E119 Type 2 diabetes mellitus without complications: Secondary | ICD-10-CM | POA: Diagnosis not present

## 2020-08-06 DIAGNOSIS — Z9981 Dependence on supplemental oxygen: Secondary | ICD-10-CM | POA: Diagnosis not present

## 2020-08-08 DIAGNOSIS — E119 Type 2 diabetes mellitus without complications: Secondary | ICD-10-CM | POA: Diagnosis not present

## 2020-08-08 DIAGNOSIS — I110 Hypertensive heart disease with heart failure: Secondary | ICD-10-CM | POA: Diagnosis not present

## 2020-08-08 DIAGNOSIS — I4891 Unspecified atrial fibrillation: Secondary | ICD-10-CM | POA: Diagnosis not present

## 2020-08-08 DIAGNOSIS — I509 Heart failure, unspecified: Secondary | ICD-10-CM | POA: Diagnosis not present

## 2020-08-08 DIAGNOSIS — J449 Chronic obstructive pulmonary disease, unspecified: Secondary | ICD-10-CM | POA: Diagnosis not present

## 2020-08-26 DIAGNOSIS — R059 Cough, unspecified: Secondary | ICD-10-CM | POA: Diagnosis not present

## 2020-08-26 DIAGNOSIS — J45909 Unspecified asthma, uncomplicated: Secondary | ICD-10-CM | POA: Diagnosis not present

## 2020-08-26 DIAGNOSIS — R062 Wheezing: Secondary | ICD-10-CM | POA: Diagnosis not present

## 2020-09-04 DIAGNOSIS — I10 Essential (primary) hypertension: Secondary | ICD-10-CM | POA: Diagnosis not present

## 2020-09-04 DIAGNOSIS — R000 Tachycardia, unspecified: Secondary | ICD-10-CM | POA: Diagnosis not present

## 2020-09-04 DIAGNOSIS — E119 Type 2 diabetes mellitus without complications: Secondary | ICD-10-CM | POA: Diagnosis not present

## 2020-09-04 DIAGNOSIS — Z20822 Contact with and (suspected) exposure to covid-19: Secondary | ICD-10-CM | POA: Diagnosis not present

## 2020-09-04 DIAGNOSIS — I4891 Unspecified atrial fibrillation: Secondary | ICD-10-CM | POA: Diagnosis not present

## 2020-09-04 DIAGNOSIS — M549 Dorsalgia, unspecified: Secondary | ICD-10-CM | POA: Diagnosis not present

## 2020-09-04 DIAGNOSIS — R0602 Shortness of breath: Secondary | ICD-10-CM | POA: Diagnosis not present

## 2020-09-04 DIAGNOSIS — I700 Atherosclerosis of aorta: Secondary | ICD-10-CM | POA: Diagnosis not present

## 2020-09-04 DIAGNOSIS — J441 Chronic obstructive pulmonary disease with (acute) exacerbation: Secondary | ICD-10-CM | POA: Diagnosis not present

## 2020-09-04 DIAGNOSIS — R059 Cough, unspecified: Secondary | ICD-10-CM | POA: Diagnosis not present

## 2020-09-17 DIAGNOSIS — M160 Bilateral primary osteoarthritis of hip: Secondary | ICD-10-CM | POA: Diagnosis not present

## 2020-09-17 DIAGNOSIS — J449 Chronic obstructive pulmonary disease, unspecified: Secondary | ICD-10-CM | POA: Diagnosis not present

## 2020-09-17 DIAGNOSIS — Z0000 Encounter for general adult medical examination without abnormal findings: Secondary | ICD-10-CM | POA: Diagnosis not present

## 2020-09-17 DIAGNOSIS — E7849 Other hyperlipidemia: Secondary | ICD-10-CM | POA: Diagnosis not present

## 2020-09-17 DIAGNOSIS — I10 Essential (primary) hypertension: Secondary | ICD-10-CM | POA: Diagnosis not present

## 2020-09-17 DIAGNOSIS — N182 Chronic kidney disease, stage 2 (mild): Secondary | ICD-10-CM | POA: Diagnosis not present

## 2020-09-17 DIAGNOSIS — M545 Low back pain, unspecified: Secondary | ICD-10-CM | POA: Diagnosis not present

## 2020-09-17 DIAGNOSIS — I872 Venous insufficiency (chronic) (peripheral): Secondary | ICD-10-CM | POA: Diagnosis not present

## 2020-09-17 DIAGNOSIS — E1121 Type 2 diabetes mellitus with diabetic nephropathy: Secondary | ICD-10-CM | POA: Diagnosis not present

## 2020-09-23 DIAGNOSIS — R062 Wheezing: Secondary | ICD-10-CM | POA: Diagnosis not present

## 2020-09-23 DIAGNOSIS — J45909 Unspecified asthma, uncomplicated: Secondary | ICD-10-CM | POA: Diagnosis not present

## 2020-09-23 DIAGNOSIS — R059 Cough, unspecified: Secondary | ICD-10-CM | POA: Diagnosis not present

## 2020-10-03 DIAGNOSIS — Z9181 History of falling: Secondary | ICD-10-CM | POA: Diagnosis not present

## 2020-10-03 DIAGNOSIS — J449 Chronic obstructive pulmonary disease, unspecified: Secondary | ICD-10-CM | POA: Diagnosis not present

## 2020-10-03 DIAGNOSIS — I4891 Unspecified atrial fibrillation: Secondary | ICD-10-CM | POA: Diagnosis not present

## 2020-10-03 DIAGNOSIS — M545 Low back pain, unspecified: Secondary | ICD-10-CM | POA: Diagnosis not present

## 2020-10-03 DIAGNOSIS — Z7983 Long term (current) use of bisphosphonates: Secondary | ICD-10-CM | POA: Diagnosis not present

## 2020-10-03 DIAGNOSIS — E039 Hypothyroidism, unspecified: Secondary | ICD-10-CM | POA: Diagnosis not present

## 2020-10-03 DIAGNOSIS — Z9981 Dependence on supplemental oxygen: Secondary | ICD-10-CM | POA: Diagnosis not present

## 2020-10-03 DIAGNOSIS — I110 Hypertensive heart disease with heart failure: Secondary | ICD-10-CM | POA: Diagnosis not present

## 2020-10-03 DIAGNOSIS — Z7984 Long term (current) use of oral hypoglycemic drugs: Secondary | ICD-10-CM | POA: Diagnosis not present

## 2020-10-03 DIAGNOSIS — E785 Hyperlipidemia, unspecified: Secondary | ICD-10-CM | POA: Diagnosis not present

## 2020-10-03 DIAGNOSIS — G8929 Other chronic pain: Secondary | ICD-10-CM | POA: Diagnosis not present

## 2020-10-03 DIAGNOSIS — E119 Type 2 diabetes mellitus without complications: Secondary | ICD-10-CM | POA: Diagnosis not present

## 2020-10-03 DIAGNOSIS — I509 Heart failure, unspecified: Secondary | ICD-10-CM | POA: Diagnosis not present

## 2020-10-09 DIAGNOSIS — I10 Essential (primary) hypertension: Secondary | ICD-10-CM | POA: Diagnosis not present

## 2020-10-09 DIAGNOSIS — J449 Chronic obstructive pulmonary disease, unspecified: Secondary | ICD-10-CM | POA: Diagnosis not present

## 2020-10-09 DIAGNOSIS — M545 Low back pain, unspecified: Secondary | ICD-10-CM | POA: Diagnosis not present

## 2020-10-09 DIAGNOSIS — N182 Chronic kidney disease, stage 2 (mild): Secondary | ICD-10-CM | POA: Diagnosis not present

## 2020-10-09 DIAGNOSIS — E7849 Other hyperlipidemia: Secondary | ICD-10-CM | POA: Diagnosis not present

## 2020-10-09 DIAGNOSIS — E1121 Type 2 diabetes mellitus with diabetic nephropathy: Secondary | ICD-10-CM | POA: Diagnosis not present

## 2020-10-21 DIAGNOSIS — I509 Heart failure, unspecified: Secondary | ICD-10-CM | POA: Diagnosis not present

## 2020-10-21 DIAGNOSIS — E119 Type 2 diabetes mellitus without complications: Secondary | ICD-10-CM | POA: Diagnosis not present

## 2020-10-21 DIAGNOSIS — I4891 Unspecified atrial fibrillation: Secondary | ICD-10-CM | POA: Diagnosis not present

## 2020-10-21 DIAGNOSIS — J449 Chronic obstructive pulmonary disease, unspecified: Secondary | ICD-10-CM | POA: Diagnosis not present

## 2020-10-21 DIAGNOSIS — I110 Hypertensive heart disease with heart failure: Secondary | ICD-10-CM | POA: Diagnosis not present

## 2020-10-24 DIAGNOSIS — R062 Wheezing: Secondary | ICD-10-CM | POA: Diagnosis not present

## 2020-10-24 DIAGNOSIS — J45909 Unspecified asthma, uncomplicated: Secondary | ICD-10-CM | POA: Diagnosis not present

## 2020-10-24 DIAGNOSIS — R059 Cough, unspecified: Secondary | ICD-10-CM | POA: Diagnosis not present

## 2020-10-28 ENCOUNTER — Other Ambulatory Visit (INDEPENDENT_AMBULATORY_CARE_PROVIDER_SITE_OTHER): Payer: Self-pay

## 2020-10-28 DIAGNOSIS — Z1211 Encounter for screening for malignant neoplasm of colon: Secondary | ICD-10-CM

## 2020-11-09 DIAGNOSIS — N182 Chronic kidney disease, stage 2 (mild): Secondary | ICD-10-CM | POA: Diagnosis not present

## 2020-11-09 DIAGNOSIS — M545 Low back pain, unspecified: Secondary | ICD-10-CM | POA: Diagnosis not present

## 2020-11-09 DIAGNOSIS — E1121 Type 2 diabetes mellitus with diabetic nephropathy: Secondary | ICD-10-CM | POA: Diagnosis not present

## 2020-11-09 DIAGNOSIS — I10 Essential (primary) hypertension: Secondary | ICD-10-CM | POA: Diagnosis not present

## 2020-11-09 DIAGNOSIS — J449 Chronic obstructive pulmonary disease, unspecified: Secondary | ICD-10-CM | POA: Diagnosis not present

## 2020-11-09 DIAGNOSIS — E7849 Other hyperlipidemia: Secondary | ICD-10-CM | POA: Diagnosis not present

## 2020-11-23 DIAGNOSIS — R062 Wheezing: Secondary | ICD-10-CM | POA: Diagnosis not present

## 2020-11-23 DIAGNOSIS — J45909 Unspecified asthma, uncomplicated: Secondary | ICD-10-CM | POA: Diagnosis not present

## 2020-11-23 DIAGNOSIS — R059 Cough, unspecified: Secondary | ICD-10-CM | POA: Diagnosis not present

## 2020-12-02 DIAGNOSIS — Z9981 Dependence on supplemental oxygen: Secondary | ICD-10-CM | POA: Diagnosis not present

## 2020-12-02 DIAGNOSIS — J449 Chronic obstructive pulmonary disease, unspecified: Secondary | ICD-10-CM | POA: Diagnosis not present

## 2020-12-02 DIAGNOSIS — Z7983 Long term (current) use of bisphosphonates: Secondary | ICD-10-CM | POA: Diagnosis not present

## 2020-12-02 DIAGNOSIS — G8929 Other chronic pain: Secondary | ICD-10-CM | POA: Diagnosis not present

## 2020-12-02 DIAGNOSIS — Z7984 Long term (current) use of oral hypoglycemic drugs: Secondary | ICD-10-CM | POA: Diagnosis not present

## 2020-12-02 DIAGNOSIS — Z9181 History of falling: Secondary | ICD-10-CM | POA: Diagnosis not present

## 2020-12-02 DIAGNOSIS — I509 Heart failure, unspecified: Secondary | ICD-10-CM | POA: Diagnosis not present

## 2020-12-02 DIAGNOSIS — M545 Low back pain, unspecified: Secondary | ICD-10-CM | POA: Diagnosis not present

## 2020-12-02 DIAGNOSIS — E119 Type 2 diabetes mellitus without complications: Secondary | ICD-10-CM | POA: Diagnosis not present

## 2020-12-02 DIAGNOSIS — E039 Hypothyroidism, unspecified: Secondary | ICD-10-CM | POA: Diagnosis not present

## 2020-12-02 DIAGNOSIS — I110 Hypertensive heart disease with heart failure: Secondary | ICD-10-CM | POA: Diagnosis not present

## 2020-12-02 DIAGNOSIS — E785 Hyperlipidemia, unspecified: Secondary | ICD-10-CM | POA: Diagnosis not present

## 2020-12-02 DIAGNOSIS — I4891 Unspecified atrial fibrillation: Secondary | ICD-10-CM | POA: Diagnosis not present

## 2020-12-06 DIAGNOSIS — E119 Type 2 diabetes mellitus without complications: Secondary | ICD-10-CM | POA: Diagnosis not present

## 2020-12-06 DIAGNOSIS — I509 Heart failure, unspecified: Secondary | ICD-10-CM | POA: Diagnosis not present

## 2020-12-06 DIAGNOSIS — J449 Chronic obstructive pulmonary disease, unspecified: Secondary | ICD-10-CM | POA: Diagnosis not present

## 2020-12-06 DIAGNOSIS — I110 Hypertensive heart disease with heart failure: Secondary | ICD-10-CM | POA: Diagnosis not present

## 2020-12-06 DIAGNOSIS — I4891 Unspecified atrial fibrillation: Secondary | ICD-10-CM | POA: Diagnosis not present

## 2020-12-13 DIAGNOSIS — N182 Chronic kidney disease, stage 2 (mild): Secondary | ICD-10-CM | POA: Diagnosis not present

## 2020-12-13 DIAGNOSIS — I10 Essential (primary) hypertension: Secondary | ICD-10-CM | POA: Diagnosis not present

## 2020-12-13 DIAGNOSIS — M160 Bilateral primary osteoarthritis of hip: Secondary | ICD-10-CM | POA: Diagnosis not present

## 2020-12-13 DIAGNOSIS — J449 Chronic obstructive pulmonary disease, unspecified: Secondary | ICD-10-CM | POA: Diagnosis not present

## 2020-12-13 DIAGNOSIS — E7849 Other hyperlipidemia: Secondary | ICD-10-CM | POA: Diagnosis not present

## 2020-12-13 DIAGNOSIS — E1121 Type 2 diabetes mellitus with diabetic nephropathy: Secondary | ICD-10-CM | POA: Diagnosis not present

## 2020-12-13 DIAGNOSIS — I872 Venous insufficiency (chronic) (peripheral): Secondary | ICD-10-CM | POA: Diagnosis not present

## 2020-12-13 DIAGNOSIS — M545 Low back pain, unspecified: Secondary | ICD-10-CM | POA: Diagnosis not present

## 2020-12-24 DIAGNOSIS — Z7983 Long term (current) use of bisphosphonates: Secondary | ICD-10-CM | POA: Diagnosis not present

## 2020-12-24 DIAGNOSIS — I129 Hypertensive chronic kidney disease with stage 1 through stage 4 chronic kidney disease, or unspecified chronic kidney disease: Secondary | ICD-10-CM | POA: Diagnosis not present

## 2020-12-24 DIAGNOSIS — Z79899 Other long term (current) drug therapy: Secondary | ICD-10-CM | POA: Diagnosis not present

## 2020-12-24 DIAGNOSIS — E785 Hyperlipidemia, unspecified: Secondary | ICD-10-CM | POA: Diagnosis not present

## 2020-12-24 DIAGNOSIS — R059 Cough, unspecified: Secondary | ICD-10-CM | POA: Diagnosis not present

## 2020-12-24 DIAGNOSIS — L97221 Non-pressure chronic ulcer of left calf limited to breakdown of skin: Secondary | ICD-10-CM | POA: Diagnosis not present

## 2020-12-24 DIAGNOSIS — T148XXA Other injury of unspecified body region, initial encounter: Secondary | ICD-10-CM | POA: Diagnosis not present

## 2020-12-24 DIAGNOSIS — J449 Chronic obstructive pulmonary disease, unspecified: Secondary | ICD-10-CM | POA: Diagnosis not present

## 2020-12-24 DIAGNOSIS — J45909 Unspecified asthma, uncomplicated: Secondary | ICD-10-CM | POA: Diagnosis not present

## 2020-12-24 DIAGNOSIS — R609 Edema, unspecified: Secondary | ICD-10-CM | POA: Diagnosis not present

## 2020-12-24 DIAGNOSIS — R062 Wheezing: Secondary | ICD-10-CM | POA: Diagnosis not present

## 2020-12-24 DIAGNOSIS — N182 Chronic kidney disease, stage 2 (mild): Secondary | ICD-10-CM | POA: Diagnosis not present

## 2020-12-24 DIAGNOSIS — E1122 Type 2 diabetes mellitus with diabetic chronic kidney disease: Secondary | ICD-10-CM | POA: Diagnosis not present

## 2020-12-24 DIAGNOSIS — I872 Venous insufficiency (chronic) (peripheral): Secondary | ICD-10-CM | POA: Diagnosis not present

## 2020-12-24 DIAGNOSIS — L97911 Non-pressure chronic ulcer of unspecified part of right lower leg limited to breakdown of skin: Secondary | ICD-10-CM | POA: Diagnosis not present

## 2020-12-27 DIAGNOSIS — G8929 Other chronic pain: Secondary | ICD-10-CM | POA: Diagnosis not present

## 2020-12-27 DIAGNOSIS — Z7984 Long term (current) use of oral hypoglycemic drugs: Secondary | ICD-10-CM | POA: Diagnosis not present

## 2020-12-27 DIAGNOSIS — I110 Hypertensive heart disease with heart failure: Secondary | ICD-10-CM | POA: Diagnosis not present

## 2020-12-27 DIAGNOSIS — J449 Chronic obstructive pulmonary disease, unspecified: Secondary | ICD-10-CM | POA: Diagnosis not present

## 2020-12-27 DIAGNOSIS — E119 Type 2 diabetes mellitus without complications: Secondary | ICD-10-CM | POA: Diagnosis not present

## 2020-12-27 DIAGNOSIS — I4891 Unspecified atrial fibrillation: Secondary | ICD-10-CM | POA: Diagnosis not present

## 2020-12-27 DIAGNOSIS — Z9181 History of falling: Secondary | ICD-10-CM | POA: Diagnosis not present

## 2020-12-27 DIAGNOSIS — I509 Heart failure, unspecified: Secondary | ICD-10-CM | POA: Diagnosis not present

## 2020-12-27 DIAGNOSIS — E039 Hypothyroidism, unspecified: Secondary | ICD-10-CM | POA: Diagnosis not present

## 2020-12-27 DIAGNOSIS — Z7983 Long term (current) use of bisphosphonates: Secondary | ICD-10-CM | POA: Diagnosis not present

## 2020-12-27 DIAGNOSIS — E785 Hyperlipidemia, unspecified: Secondary | ICD-10-CM | POA: Diagnosis not present

## 2020-12-27 DIAGNOSIS — M545 Low back pain, unspecified: Secondary | ICD-10-CM | POA: Diagnosis not present

## 2020-12-27 DIAGNOSIS — Z9981 Dependence on supplemental oxygen: Secondary | ICD-10-CM | POA: Diagnosis not present

## 2020-12-30 ENCOUNTER — Telehealth (INDEPENDENT_AMBULATORY_CARE_PROVIDER_SITE_OTHER): Payer: Self-pay

## 2020-12-30 ENCOUNTER — Encounter (INDEPENDENT_AMBULATORY_CARE_PROVIDER_SITE_OTHER): Payer: Self-pay

## 2020-12-30 DIAGNOSIS — Z9981 Dependence on supplemental oxygen: Secondary | ICD-10-CM | POA: Diagnosis not present

## 2020-12-30 DIAGNOSIS — E119 Type 2 diabetes mellitus without complications: Secondary | ICD-10-CM | POA: Diagnosis not present

## 2020-12-30 DIAGNOSIS — I509 Heart failure, unspecified: Secondary | ICD-10-CM | POA: Diagnosis not present

## 2020-12-30 DIAGNOSIS — I4891 Unspecified atrial fibrillation: Secondary | ICD-10-CM | POA: Diagnosis not present

## 2020-12-30 DIAGNOSIS — E785 Hyperlipidemia, unspecified: Secondary | ICD-10-CM | POA: Diagnosis not present

## 2020-12-30 DIAGNOSIS — E039 Hypothyroidism, unspecified: Secondary | ICD-10-CM | POA: Diagnosis not present

## 2020-12-30 DIAGNOSIS — I110 Hypertensive heart disease with heart failure: Secondary | ICD-10-CM | POA: Diagnosis not present

## 2020-12-30 DIAGNOSIS — Z9181 History of falling: Secondary | ICD-10-CM | POA: Diagnosis not present

## 2020-12-30 DIAGNOSIS — M545 Low back pain, unspecified: Secondary | ICD-10-CM | POA: Diagnosis not present

## 2020-12-30 DIAGNOSIS — Z7984 Long term (current) use of oral hypoglycemic drugs: Secondary | ICD-10-CM | POA: Diagnosis not present

## 2020-12-30 DIAGNOSIS — G8929 Other chronic pain: Secondary | ICD-10-CM | POA: Diagnosis not present

## 2020-12-30 DIAGNOSIS — Z7983 Long term (current) use of bisphosphonates: Secondary | ICD-10-CM | POA: Diagnosis not present

## 2020-12-30 DIAGNOSIS — J449 Chronic obstructive pulmonary disease, unspecified: Secondary | ICD-10-CM | POA: Diagnosis not present

## 2020-12-30 MED ORDER — PEG 3350-KCL-NA BICARB-NACL 420 G PO SOLR: 4000.0000 mL | ORAL | 0 refills | Status: DC

## 2020-12-30 NOTE — Telephone Encounter (Signed)
Taylor Shields, CMA  

## 2020-12-30 NOTE — Telephone Encounter (Signed)
Ok to schedule.  Thanks,  Ollin Hochmuth Castaneda Mayorga, MD Gastroenterology and Hepatology [facility]  

## 2020-12-30 NOTE — Telephone Encounter (Signed)
Referring MD/PCP: Hasanaj  Procedure: Tcs  Reason/Indication:  Screening  Has patient had this procedure before?  no  If so, when, by whom and where?    Is there a family history of colon cancer?  no  Who?  What age when diagnosed?    Is patient diabetic? If yes, Type 1 or Type 2   yes Type       Does patient have prosthetic heart valve or mechanical valve?  no  Do you have a pacemaker/defibrillator?  no  Has patient ever had endocarditis/atrial fibrillation? no  Does patient use oxygen? yes  Has patient had joint replacement within last 12 months?  no  Is patient constipated or do they take laxatives? no  Does patient have a history of alcohol/drug use?  no  Have you had a stroke/heart attack last 6 mths? no  Do you take medicine for weight loss?  no  For female patients,: do you still have your menstrual cycle? no  Is patient on blood thinner such as Coumadin, Plavix and/or Aspirin? no  Medications: losartin 100 mg daily, metformin 500 mg daily, oyster shell calcium/vit D daily, VIt D daily 2000 iu daily,albuterol 90 mcg bid, allergy relief daily, clonidine 0.2 mg tid, diltiazem 360 mg daily, esomeprazole 40 mg daily, labetalol  300 mg daily, potassium 20 meq daily, simvastatin 20 mg daily, symbicort 2 puffs bid  Allergies: pcn  Medication Adjustment per Dr Rehman/Dr Jenetta Downer hold metformin the evening prior and morning of procedure  Procedure date & time: 01/29/21 7:30

## 2020-12-31 ENCOUNTER — Ambulatory Visit (HOSPITAL_COMMUNITY): Payer: Medicare Other | Attending: Internal Medicine | Admitting: Physical Therapy

## 2020-12-31 ENCOUNTER — Telehealth (HOSPITAL_COMMUNITY): Payer: Self-pay | Admitting: Physical Therapy

## 2020-12-31 DIAGNOSIS — T148XXA Other injury of unspecified body region, initial encounter: Secondary | ICD-10-CM | POA: Diagnosis not present

## 2020-12-31 NOTE — Telephone Encounter (Signed)
Pt states that she was unaware of her appointment.  Therapist explained that she had an appointment on Thursday at [facility].  Rayetta Humphrey, [place] CLT [phone]

## 2021-01-02 ENCOUNTER — Ambulatory Visit (HOSPITAL_COMMUNITY): Payer: Medicare Other | Admitting: Physical Therapy

## 2021-01-02 DIAGNOSIS — I4891 Unspecified atrial fibrillation: Secondary | ICD-10-CM | POA: Diagnosis not present

## 2021-01-02 DIAGNOSIS — I509 Heart failure, unspecified: Secondary | ICD-10-CM | POA: Diagnosis not present

## 2021-01-02 DIAGNOSIS — Z7984 Long term (current) use of oral hypoglycemic drugs: Secondary | ICD-10-CM | POA: Diagnosis not present

## 2021-01-02 DIAGNOSIS — Z9181 History of falling: Secondary | ICD-10-CM | POA: Diagnosis not present

## 2021-01-02 DIAGNOSIS — E785 Hyperlipidemia, unspecified: Secondary | ICD-10-CM | POA: Diagnosis not present

## 2021-01-02 DIAGNOSIS — E039 Hypothyroidism, unspecified: Secondary | ICD-10-CM | POA: Diagnosis not present

## 2021-01-02 DIAGNOSIS — E119 Type 2 diabetes mellitus without complications: Secondary | ICD-10-CM | POA: Diagnosis not present

## 2021-01-02 DIAGNOSIS — J449 Chronic obstructive pulmonary disease, unspecified: Secondary | ICD-10-CM | POA: Diagnosis not present

## 2021-01-02 DIAGNOSIS — G8929 Other chronic pain: Secondary | ICD-10-CM | POA: Diagnosis not present

## 2021-01-02 DIAGNOSIS — M545 Low back pain, unspecified: Secondary | ICD-10-CM | POA: Diagnosis not present

## 2021-01-02 DIAGNOSIS — Z9981 Dependence on supplemental oxygen: Secondary | ICD-10-CM | POA: Diagnosis not present

## 2021-01-02 DIAGNOSIS — Z7983 Long term (current) use of bisphosphonates: Secondary | ICD-10-CM | POA: Diagnosis not present

## 2021-01-02 DIAGNOSIS — I110 Hypertensive heart disease with heart failure: Secondary | ICD-10-CM | POA: Diagnosis not present

## 2021-01-07 ENCOUNTER — Encounter (HOSPITAL_COMMUNITY): Payer: Medicare Other

## 2021-01-07 DIAGNOSIS — E785 Hyperlipidemia, unspecified: Secondary | ICD-10-CM | POA: Diagnosis not present

## 2021-01-07 DIAGNOSIS — I509 Heart failure, unspecified: Secondary | ICD-10-CM | POA: Diagnosis not present

## 2021-01-07 DIAGNOSIS — E039 Hypothyroidism, unspecified: Secondary | ICD-10-CM | POA: Diagnosis not present

## 2021-01-07 DIAGNOSIS — Z7983 Long term (current) use of bisphosphonates: Secondary | ICD-10-CM | POA: Diagnosis not present

## 2021-01-07 DIAGNOSIS — E119 Type 2 diabetes mellitus without complications: Secondary | ICD-10-CM | POA: Diagnosis not present

## 2021-01-07 DIAGNOSIS — Z9981 Dependence on supplemental oxygen: Secondary | ICD-10-CM | POA: Diagnosis not present

## 2021-01-07 DIAGNOSIS — Z9181 History of falling: Secondary | ICD-10-CM | POA: Diagnosis not present

## 2021-01-07 DIAGNOSIS — G8929 Other chronic pain: Secondary | ICD-10-CM | POA: Diagnosis not present

## 2021-01-07 DIAGNOSIS — J449 Chronic obstructive pulmonary disease, unspecified: Secondary | ICD-10-CM | POA: Diagnosis not present

## 2021-01-07 DIAGNOSIS — I110 Hypertensive heart disease with heart failure: Secondary | ICD-10-CM | POA: Diagnosis not present

## 2021-01-07 DIAGNOSIS — Z7984 Long term (current) use of oral hypoglycemic drugs: Secondary | ICD-10-CM | POA: Diagnosis not present

## 2021-01-07 DIAGNOSIS — M545 Low back pain, unspecified: Secondary | ICD-10-CM | POA: Diagnosis not present

## 2021-01-07 DIAGNOSIS — I4891 Unspecified atrial fibrillation: Secondary | ICD-10-CM | POA: Diagnosis not present

## 2021-01-09 ENCOUNTER — Encounter (HOSPITAL_COMMUNITY): Payer: Medicare Other

## 2021-01-09 DIAGNOSIS — E1121 Type 2 diabetes mellitus with diabetic nephropathy: Secondary | ICD-10-CM | POA: Diagnosis not present

## 2021-01-09 DIAGNOSIS — I10 Essential (primary) hypertension: Secondary | ICD-10-CM | POA: Diagnosis not present

## 2021-01-09 DIAGNOSIS — M545 Low back pain, unspecified: Secondary | ICD-10-CM | POA: Diagnosis not present

## 2021-01-09 DIAGNOSIS — E7849 Other hyperlipidemia: Secondary | ICD-10-CM | POA: Diagnosis not present

## 2021-01-09 DIAGNOSIS — J449 Chronic obstructive pulmonary disease, unspecified: Secondary | ICD-10-CM | POA: Diagnosis not present

## 2021-01-10 ENCOUNTER — Encounter (HOSPITAL_COMMUNITY): Payer: Medicare Other

## 2021-01-10 DIAGNOSIS — Z7984 Long term (current) use of oral hypoglycemic drugs: Secondary | ICD-10-CM | POA: Diagnosis not present

## 2021-01-10 DIAGNOSIS — E039 Hypothyroidism, unspecified: Secondary | ICD-10-CM | POA: Diagnosis not present

## 2021-01-10 DIAGNOSIS — G8929 Other chronic pain: Secondary | ICD-10-CM | POA: Diagnosis not present

## 2021-01-10 DIAGNOSIS — E119 Type 2 diabetes mellitus without complications: Secondary | ICD-10-CM | POA: Diagnosis not present

## 2021-01-10 DIAGNOSIS — Z9981 Dependence on supplemental oxygen: Secondary | ICD-10-CM | POA: Diagnosis not present

## 2021-01-10 DIAGNOSIS — Z7983 Long term (current) use of bisphosphonates: Secondary | ICD-10-CM | POA: Diagnosis not present

## 2021-01-10 DIAGNOSIS — E785 Hyperlipidemia, unspecified: Secondary | ICD-10-CM | POA: Diagnosis not present

## 2021-01-10 DIAGNOSIS — M545 Low back pain, unspecified: Secondary | ICD-10-CM | POA: Diagnosis not present

## 2021-01-10 DIAGNOSIS — J449 Chronic obstructive pulmonary disease, unspecified: Secondary | ICD-10-CM | POA: Diagnosis not present

## 2021-01-10 DIAGNOSIS — Z9181 History of falling: Secondary | ICD-10-CM | POA: Diagnosis not present

## 2021-01-10 DIAGNOSIS — I509 Heart failure, unspecified: Secondary | ICD-10-CM | POA: Diagnosis not present

## 2021-01-10 DIAGNOSIS — I110 Hypertensive heart disease with heart failure: Secondary | ICD-10-CM | POA: Diagnosis not present

## 2021-01-10 DIAGNOSIS — I4891 Unspecified atrial fibrillation: Secondary | ICD-10-CM | POA: Diagnosis not present

## 2021-01-14 ENCOUNTER — Encounter (HOSPITAL_COMMUNITY): Payer: Medicare Other | Admitting: Physical Therapy

## 2021-01-14 DIAGNOSIS — I509 Heart failure, unspecified: Secondary | ICD-10-CM | POA: Diagnosis not present

## 2021-01-14 DIAGNOSIS — E039 Hypothyroidism, unspecified: Secondary | ICD-10-CM | POA: Diagnosis not present

## 2021-01-14 DIAGNOSIS — G8929 Other chronic pain: Secondary | ICD-10-CM | POA: Diagnosis not present

## 2021-01-14 DIAGNOSIS — E785 Hyperlipidemia, unspecified: Secondary | ICD-10-CM | POA: Diagnosis not present

## 2021-01-14 DIAGNOSIS — Z9181 History of falling: Secondary | ICD-10-CM | POA: Diagnosis not present

## 2021-01-14 DIAGNOSIS — Z7984 Long term (current) use of oral hypoglycemic drugs: Secondary | ICD-10-CM | POA: Diagnosis not present

## 2021-01-14 DIAGNOSIS — Z9981 Dependence on supplemental oxygen: Secondary | ICD-10-CM | POA: Diagnosis not present

## 2021-01-14 DIAGNOSIS — I110 Hypertensive heart disease with heart failure: Secondary | ICD-10-CM | POA: Diagnosis not present

## 2021-01-14 DIAGNOSIS — M545 Low back pain, unspecified: Secondary | ICD-10-CM | POA: Diagnosis not present

## 2021-01-14 DIAGNOSIS — J449 Chronic obstructive pulmonary disease, unspecified: Secondary | ICD-10-CM | POA: Diagnosis not present

## 2021-01-14 DIAGNOSIS — E119 Type 2 diabetes mellitus without complications: Secondary | ICD-10-CM | POA: Diagnosis not present

## 2021-01-14 DIAGNOSIS — I4891 Unspecified atrial fibrillation: Secondary | ICD-10-CM | POA: Diagnosis not present

## 2021-01-14 DIAGNOSIS — Z7983 Long term (current) use of bisphosphonates: Secondary | ICD-10-CM | POA: Diagnosis not present

## 2021-01-15 DIAGNOSIS — T148XXA Other injury of unspecified body region, initial encounter: Secondary | ICD-10-CM | POA: Diagnosis not present

## 2021-01-16 ENCOUNTER — Encounter (HOSPITAL_COMMUNITY): Payer: Medicare Other | Admitting: Physical Therapy

## 2021-01-16 DIAGNOSIS — E119 Type 2 diabetes mellitus without complications: Secondary | ICD-10-CM | POA: Diagnosis not present

## 2021-01-16 DIAGNOSIS — Z9181 History of falling: Secondary | ICD-10-CM | POA: Diagnosis not present

## 2021-01-16 DIAGNOSIS — I110 Hypertensive heart disease with heart failure: Secondary | ICD-10-CM | POA: Diagnosis not present

## 2021-01-16 DIAGNOSIS — Z7984 Long term (current) use of oral hypoglycemic drugs: Secondary | ICD-10-CM | POA: Diagnosis not present

## 2021-01-16 DIAGNOSIS — Z7983 Long term (current) use of bisphosphonates: Secondary | ICD-10-CM | POA: Diagnosis not present

## 2021-01-16 DIAGNOSIS — I4891 Unspecified atrial fibrillation: Secondary | ICD-10-CM | POA: Diagnosis not present

## 2021-01-16 DIAGNOSIS — E039 Hypothyroidism, unspecified: Secondary | ICD-10-CM | POA: Diagnosis not present

## 2021-01-16 DIAGNOSIS — G8929 Other chronic pain: Secondary | ICD-10-CM | POA: Diagnosis not present

## 2021-01-16 DIAGNOSIS — J449 Chronic obstructive pulmonary disease, unspecified: Secondary | ICD-10-CM | POA: Diagnosis not present

## 2021-01-16 DIAGNOSIS — I509 Heart failure, unspecified: Secondary | ICD-10-CM | POA: Diagnosis not present

## 2021-01-16 DIAGNOSIS — Z9981 Dependence on supplemental oxygen: Secondary | ICD-10-CM | POA: Diagnosis not present

## 2021-01-16 DIAGNOSIS — M545 Low back pain, unspecified: Secondary | ICD-10-CM | POA: Diagnosis not present

## 2021-01-16 DIAGNOSIS — E785 Hyperlipidemia, unspecified: Secondary | ICD-10-CM | POA: Diagnosis not present

## 2021-01-17 ENCOUNTER — Encounter (HOSPITAL_COMMUNITY): Payer: Medicare Other

## 2021-01-22 DIAGNOSIS — Z7984 Long term (current) use of oral hypoglycemic drugs: Secondary | ICD-10-CM | POA: Diagnosis not present

## 2021-01-22 DIAGNOSIS — I4891 Unspecified atrial fibrillation: Secondary | ICD-10-CM | POA: Diagnosis not present

## 2021-01-22 DIAGNOSIS — M545 Low back pain, unspecified: Secondary | ICD-10-CM | POA: Diagnosis not present

## 2021-01-22 DIAGNOSIS — E785 Hyperlipidemia, unspecified: Secondary | ICD-10-CM | POA: Diagnosis not present

## 2021-01-22 DIAGNOSIS — J449 Chronic obstructive pulmonary disease, unspecified: Secondary | ICD-10-CM | POA: Diagnosis not present

## 2021-01-22 DIAGNOSIS — Z9181 History of falling: Secondary | ICD-10-CM | POA: Diagnosis not present

## 2021-01-22 DIAGNOSIS — G8929 Other chronic pain: Secondary | ICD-10-CM | POA: Diagnosis not present

## 2021-01-22 DIAGNOSIS — I110 Hypertensive heart disease with heart failure: Secondary | ICD-10-CM | POA: Diagnosis not present

## 2021-01-22 DIAGNOSIS — I509 Heart failure, unspecified: Secondary | ICD-10-CM | POA: Diagnosis not present

## 2021-01-22 DIAGNOSIS — E039 Hypothyroidism, unspecified: Secondary | ICD-10-CM | POA: Diagnosis not present

## 2021-01-22 DIAGNOSIS — E119 Type 2 diabetes mellitus without complications: Secondary | ICD-10-CM | POA: Diagnosis not present

## 2021-01-22 DIAGNOSIS — Z7983 Long term (current) use of bisphosphonates: Secondary | ICD-10-CM | POA: Diagnosis not present

## 2021-01-22 DIAGNOSIS — Z9981 Dependence on supplemental oxygen: Secondary | ICD-10-CM | POA: Diagnosis not present

## 2021-01-23 DIAGNOSIS — R062 Wheezing: Secondary | ICD-10-CM | POA: Diagnosis not present

## 2021-01-23 DIAGNOSIS — R059 Cough, unspecified: Secondary | ICD-10-CM | POA: Diagnosis not present

## 2021-01-23 DIAGNOSIS — J45909 Unspecified asthma, uncomplicated: Secondary | ICD-10-CM | POA: Diagnosis not present

## 2021-01-27 DIAGNOSIS — E785 Hyperlipidemia, unspecified: Secondary | ICD-10-CM | POA: Diagnosis not present

## 2021-01-27 DIAGNOSIS — Z9981 Dependence on supplemental oxygen: Secondary | ICD-10-CM | POA: Diagnosis not present

## 2021-01-27 DIAGNOSIS — Z9181 History of falling: Secondary | ICD-10-CM | POA: Diagnosis not present

## 2021-01-27 DIAGNOSIS — E119 Type 2 diabetes mellitus without complications: Secondary | ICD-10-CM | POA: Diagnosis not present

## 2021-01-27 DIAGNOSIS — E039 Hypothyroidism, unspecified: Secondary | ICD-10-CM | POA: Diagnosis not present

## 2021-01-27 DIAGNOSIS — I4891 Unspecified atrial fibrillation: Secondary | ICD-10-CM | POA: Diagnosis not present

## 2021-01-27 DIAGNOSIS — Z7983 Long term (current) use of bisphosphonates: Secondary | ICD-10-CM | POA: Diagnosis not present

## 2021-01-27 DIAGNOSIS — J449 Chronic obstructive pulmonary disease, unspecified: Secondary | ICD-10-CM | POA: Diagnosis not present

## 2021-01-27 DIAGNOSIS — M545 Low back pain, unspecified: Secondary | ICD-10-CM | POA: Diagnosis not present

## 2021-01-27 DIAGNOSIS — G8929 Other chronic pain: Secondary | ICD-10-CM | POA: Diagnosis not present

## 2021-01-27 DIAGNOSIS — I110 Hypertensive heart disease with heart failure: Secondary | ICD-10-CM | POA: Diagnosis not present

## 2021-01-27 DIAGNOSIS — I509 Heart failure, unspecified: Secondary | ICD-10-CM | POA: Diagnosis not present

## 2021-01-27 DIAGNOSIS — Z7984 Long term (current) use of oral hypoglycemic drugs: Secondary | ICD-10-CM | POA: Diagnosis not present

## 2021-01-29 ENCOUNTER — Encounter (HOSPITAL_COMMUNITY)
Admission: RE | Disposition: A | Discharge status: Home / Self Care | Payer: Self-pay | Source: Home / Self Care | Attending: Gastroenterology

## 2021-01-29 ENCOUNTER — Ambulatory Visit (HOSPITAL_COMMUNITY): Payer: Medicare Other | Admitting: Anesthesiology

## 2021-01-29 ENCOUNTER — Ambulatory Visit (HOSPITAL_COMMUNITY)
Admission: RE | Admit: 2021-01-29 | Discharge: 2021-01-29 | Disposition: A | Discharge status: Home / Self Care | Payer: Medicare Other | Attending: Gastroenterology | Admitting: Gastroenterology

## 2021-01-29 ENCOUNTER — Other Ambulatory Visit: Payer: Self-pay

## 2021-01-29 ENCOUNTER — Encounter (HOSPITAL_COMMUNITY): Payer: Self-pay | Admitting: Gastroenterology

## 2021-01-29 DIAGNOSIS — K623 Rectal prolapse: Secondary | ICD-10-CM | POA: Diagnosis not present

## 2021-01-29 DIAGNOSIS — Z880 Allergy status to penicillin: Secondary | ICD-10-CM | POA: Diagnosis not present

## 2021-01-29 DIAGNOSIS — Z1211 Encounter for screening for malignant neoplasm of colon: Secondary | ICD-10-CM | POA: Diagnosis not present

## 2021-01-29 DIAGNOSIS — M109 Gout, unspecified: Secondary | ICD-10-CM | POA: Insufficient documentation

## 2021-01-29 DIAGNOSIS — K5730 Diverticulosis of large intestine without perforation or abscess without bleeding: Secondary | ICD-10-CM | POA: Diagnosis not present

## 2021-01-29 DIAGNOSIS — K635 Polyp of colon: Secondary | ICD-10-CM | POA: Diagnosis not present

## 2021-01-29 DIAGNOSIS — Z7983 Long term (current) use of bisphosphonates: Secondary | ICD-10-CM | POA: Insufficient documentation

## 2021-01-29 DIAGNOSIS — Z9981 Dependence on supplemental oxygen: Secondary | ICD-10-CM | POA: Insufficient documentation

## 2021-01-29 DIAGNOSIS — Z7984 Long term (current) use of oral hypoglycemic drugs: Secondary | ICD-10-CM | POA: Insufficient documentation

## 2021-01-29 DIAGNOSIS — J449 Chronic obstructive pulmonary disease, unspecified: Secondary | ICD-10-CM | POA: Insufficient documentation

## 2021-01-29 DIAGNOSIS — I110 Hypertensive heart disease with heart failure: Secondary | ICD-10-CM | POA: Diagnosis not present

## 2021-01-29 DIAGNOSIS — D122 Benign neoplasm of ascending colon: Secondary | ICD-10-CM | POA: Diagnosis not present

## 2021-01-29 DIAGNOSIS — I509 Heart failure, unspecified: Secondary | ICD-10-CM | POA: Diagnosis not present

## 2021-01-29 DIAGNOSIS — Z79899 Other long term (current) drug therapy: Secondary | ICD-10-CM | POA: Diagnosis not present

## 2021-01-29 DIAGNOSIS — K6389 Other specified diseases of intestine: Secondary | ICD-10-CM | POA: Diagnosis not present

## 2021-01-29 HISTORY — PX: POLYPECTOMY: SHX149

## 2021-01-29 HISTORY — PX: COLONOSCOPY WITH PROPOFOL: SHX5780

## 2021-01-29 LAB — HM COLONOSCOPY

## 2021-01-29 LAB — GLUCOSE, CAPILLARY: Glucose-Capillary: 92 mg/dL (ref 70–99)

## 2021-01-29 SURGERY — COLONOSCOPY WITH PROPOFOL
Anesthesia: General

## 2021-01-29 MED ORDER — KETAMINE HCL 50 MG/5ML IJ SOSY
PREFILLED_SYRINGE | INTRAMUSCULAR | Status: AC
  Filled 2021-01-29: qty 5

## 2021-01-29 MED ORDER — KETAMINE HCL 10 MG/ML IJ SOLN
INTRAMUSCULAR | Status: DC | PRN
  Administered 2021-01-29 (×2): 10 mg via INTRAVENOUS

## 2021-01-29 MED ORDER — PROPOFOL 500 MG/50ML IV EMUL
INTRAVENOUS | Status: DC | PRN
  Administered 2021-01-29: 50 ug/kg/min via INTRAVENOUS

## 2021-01-29 MED ORDER — IPRATROPIUM-ALBUTEROL 0.5-2.5 (3) MG/3ML IN SOLN
3.0000 mL | Freq: Once | RESPIRATORY_TRACT | Status: AC
  Administered 2021-01-29: 3 mL via RESPIRATORY_TRACT

## 2021-01-29 MED ORDER — IPRATROPIUM-ALBUTEROL 0.5-2.5 (3) MG/3ML IN SOLN
RESPIRATORY_TRACT | Status: AC
  Filled 2021-01-29: qty 3

## 2021-01-29 MED ORDER — STERILE WATER FOR IRRIGATION IR SOLN
Status: DC | PRN
  Administered 2021-01-29: 400 mL

## 2021-01-29 MED ORDER — LACTATED RINGERS IV SOLN: INTRAVENOUS | Status: DC

## 2021-01-29 MED ORDER — LIDOCAINE HCL (CARDIAC) PF 100 MG/5ML IV SOSY
PREFILLED_SYRINGE | INTRAVENOUS | Status: DC | PRN
  Administered 2021-01-29: 50 mg via INTRAVENOUS

## 2021-01-29 MED ORDER — PROPOFOL 10 MG/ML IV BOLUS
INTRAVENOUS | Status: DC | PRN
Start: 2021-01-29 — End: 2021-01-29
  Administered 2021-01-29: 70 mg via INTRAVENOUS
  Administered 2021-01-29: 30 mg via INTRAVENOUS
  Administered 2021-01-29: 20 mg via INTRAVENOUS

## 2021-01-29 NOTE — Discharge Instructions (Addendum)
You are being discharged to home.  Resume your previous diet.  We are waiting for your pathology results.  Your physician has recommended a repeat colonoscopy for surveillance based on pathology results. Will need a two day prep.

## 2021-01-29 NOTE — Anesthesia Postprocedure Evaluation (Addendum)
Anesthesia Post Note  Patient: Taylor Shields  Procedure(s) Performed: COLONOSCOPY WITH PROPOFOL POLYPECTOMY INTESTINAL  Patient location during evaluation: Endoscopy Anesthesia Type: General Level of consciousness: awake and alert and oriented Pain management: pain level controlled Vital Signs Assessment: post-procedure vital signs reviewed and stable Respiratory status: spontaneous breathing and respiratory function stable Cardiovascular status: blood pressure returned to baseline and stable Postop Assessment: no apparent nausea or vomiting Anesthetic complications: no   No notable events documented.   Last Vitals:  Vitals:   01/29/21 0700 01/29/21 0827  BP: (!) 173/97 (!) 148/93  Pulse: 76   Resp: 14 12  Temp: 36.9 C 36.6 C  SpO2: 98% 96%    Last Pain:  Vitals:   01/29/21 0827  TempSrc: Oral  PainSc: 0-No pain                 Seona Clemenson C Nakyiah Kuck

## 2021-01-29 NOTE — Anesthesia Preprocedure Evaluation (Addendum)
Anesthesia Evaluation  Patient identified by MRN, date of birth, ID band Patient awake    Reviewed: Allergy & Precautions, NPO status , Patient's Chart, lab work & pertinent test results, reviewed documented beta blocker date and time   History of Anesthesia Complications Negative for: history of anesthetic complications  Airway Mallampati: II  TM Distance: >3 FB Neck ROM: Full    Dental  (+) Dental Advisory Given, Missing   Pulmonary shortness of breath, with exertion, lying and Long-Term Oxygen Therapy, asthma , COPD,  oxygen dependent,    Pulmonary exam normal breath sounds clear to auscultation       Cardiovascular Exercise Tolerance: Poor hypertension, Pt. on medications and Pt. on home beta blockers +CHF  Normal cardiovascular exam+ dysrhythmias (PACs, PVCs) Atrial Fibrillation  Rhythm:Regular Rate:Normal     Neuro/Psych negative neurological ROS  negative psych ROS   GI/Hepatic Neg liver ROS, Colon sx   Endo/Other  diabetes, Well Controlled, Type 2, Oral Hypoglycemic AgentsHypothyroidism   Renal/GU negative Renal ROS     Musculoskeletal negative musculoskeletal ROS (+)   Abdominal   Peds  Hematology negative hematology ROS (+)   Anesthesia Other Findings   Reproductive/Obstetrics negative OB ROS                           Anesthesia Physical Anesthesia Plan  ASA: 4  Anesthesia Plan: General   Post-op Pain Management:    Induction: Intravenous  PONV Risk Score and Plan: Propofol infusion  Airway Management Planned: Nasal Cannula and Natural Airway  Additional Equipment:   Intra-op Plan:   Post-operative Plan:   Informed Consent: I have reviewed the patients History and Physical, chart, labs and discussed the procedure including the risks, benefits and alternatives for the proposed anesthesia with the patient or authorized representative who has indicated his/her  understanding and acceptance.     Dental advisory given  Plan Discussed with: CRNA and Surgeon  Anesthesia Plan Comments:        Anesthesia Quick Evaluation

## 2021-01-29 NOTE — H&P (Signed)
Taylor Shields is an 70 y.o. female.   Chief Complaint: Clinical cancer screening HPI: 70 year old female with past medical history of asthma, COPD, gout, hypertension and CHF, coming for screening colonoscopy. The patient has never had a colonoscopy in the past.  The patient denies having any complaints such as melena, hematochezia, abdominal pain or distention, change in her bowel movement consistency or frequency, no changes in her weight recently.  No family history of colorectal cancer.   Past Medical History:  Diagnosis Date   Asthma    CHF (congestive heart failure) (HCC)    COPD (chronic obstructive pulmonary disease) (HCC)    Gout    Hypertension     Past Surgical History:  Procedure Laterality Date   ABDOMINAL HYSTERECTOMY     CARPAL TUNNEL RELEASE Bilateral    COLON SURGERY     LIPOMA EXCISION     PARTIAL HYSTERECTOMY      Family History  Problem Relation Age of Onset   Osteoarthritis Mother    Hypertension Mother    Social History:  reports that she has never smoked. She has never used smokeless tobacco. She reports that she does not drink alcohol and does not use drugs.  Allergies:  Allergies  Allergen Reactions   Penicillins Itching    Medications Prior to Admission  Medication Sig Dispense Refill   allopurinol (ZYLOPRIM) 300 MG tablet Take 300 mg by mouth daily.     calcium-vitamin D (OSCAL WITH D) 500-200 MG-UNIT tablet Take 1 tablet by mouth.     Cholecalciferol (VITAMIN D3) 50 MCG (2000 UT) TABS Take by mouth.     cloNIDine (CATAPRES) 0.2 MG tablet Take 0.2 mg by mouth 2 (two) times daily.     diltiazem (TIADYLT ER) 360 MG 24 hr capsule Take 360 mg by mouth daily.     DiphenhydrAMINE HCl (ALLERGY MEDICATION PO) Take 1 tablet by mouth daily.     esomeprazole (NEXIUM) 40 MG capsule Take 40 mg by mouth daily at 12 noon.     fexofenadine-pseudoephedrine (ALLEGRA-D 24) 180-240 MG 24 hr tablet Take 1 tablet by mouth daily.     furosemide (LASIX) 40 MG  tablet Take 40 mg by mouth.     guaiFENesin (MUCINEX) 600 MG 12 hr tablet Take 1,200 mg by mouth 2 (two) times daily.     labetalol (NORMODYNE) 300 MG tablet Take 300 mg by mouth 2 (two) times daily.     levothyroxine (SYNTHROID) 25 MCG tablet Take 25 mcg by mouth daily before breakfast.     losartan (COZAAR) 100 MG tablet Take 100 mg by mouth daily.     metFORMIN (GLUCOPHAGE-XR) 500 MG 24 hr tablet Take 500 mg by mouth daily with breakfast.     polyethylene glycol-electrolytes (TRILYTE) 420 g solution Take 4,000 mLs by mouth as directed. 4000 mL 0   potassium chloride SA (K-DUR,KLOR-CON) 20 MEQ tablet Take 20 mEq by mouth 2 (two) times daily.     simvastatin (ZOCOR) 20 MG tablet Take 20 mg by mouth daily.     albuterol (PROAIR HFA) 108 (90 BASE) MCG/ACT inhaler Inhale 1 puff into the lungs every 6 (six) hours as needed for wheezing or shortness of breath.     alendronate (FOSAMAX) 70 MG tablet Take 70 mg by mouth once a week. Take with a full glass of water on an empty stomach.     ciprofloxacin (CIPRO) 500 MG tablet Take 1 tablet (500 mg total) by mouth every 12 (twelve) hours. [place]  tablet 0   doxycycline (VIBRAMYCIN) 100 MG capsule Take 1 capsule (100 mg total) by mouth 2 (two) times daily. (Patient not taking: Reported on 01/30/2019) 20 capsule 0   HYDROcodone-acetaminophen (NORCO/VICODIN) 5-325 MG per tablet Take 2 tablets by mouth every 4 (four) hours as needed. (Patient not taking: Reported on 02/08/2019) 10 tablet 0   ondansetron (ZOFRAN ODT) 8 MG disintegrating tablet Take 1 tablet (8 mg total) by mouth every 8 (eight) hours as needed for nausea or vomiting. (Patient not taking: Reported on 01/30/2019) 20 tablet 0   sulfamethoxazole-trimethoprim (BACTRIM DS) 800-160 MG tablet Take 1 tablet by mouth 2 (two) times daily.      Results for orders placed or performed during the hospital encounter of 01/29/21 (from the past 48 hour(s))  Glucose, capillary     Status: None   Collection Time:  01/29/21  7:09 AM  Result Value Ref Range   Glucose-Capillary 92 70 - 99 mg/dL    Comment: Glucose reference range applies only to samples taken after fasting for at least 8 hours.   No results found.  Review of Systems  Constitutional: Negative.   HENT: Negative.    Eyes: Negative.   Respiratory: Negative.    Cardiovascular: Negative.   Gastrointestinal: Negative.   Endocrine: Negative.   Genitourinary: Negative.   Musculoskeletal: Negative.   Skin: Negative.   Allergic/Immunologic: Negative.   Neurological: Negative.   Hematological: Negative.   Psychiatric/Behavioral: Negative.     Blood pressure (!) 173/97, pulse 76, temperature 98.4 F (36.9 C), temperature source Oral, resp. rate 14, height 5' 1.5" (1.562 m), weight 77.1 kg, SpO2 98 %. Physical Exam  GENERAL: The patient is AO x3, in no acute distress. HEENT: Head is normocephalic and atraumatic. EOMI are intact. Mouth is well hydrated and without lesions. NECK: Supple. No masses LUNGS: Clear to auscultation. No presence of rhonchi/wheezing/rales. Adequate chest expansion HEART: RRR, normal s1 and s2. ABDOMEN: Soft, nontender, no guarding, no peritoneal signs, and nondistended. BS +. No masses. EXTREMITIES: Without any cyanosis, clubbing, rash, lesions or edema. NEUROLOGIC: AOx3, no focal motor deficit. SKIN: no jaundice, no rashes  Assessment/Plan 70 year old female with past medical history of asthma, COPD, gout, hypertension and CHF, coming for screening colonoscopy. The patient is at average risk for colorectal cancer.  We will proceed with colonoscopy today.  Harvel Quale, MD 01/29/2021, 7:41 AM

## 2021-01-29 NOTE — Anesthesia Procedure Notes (Signed)
Date/Time: 01/29/2021 7:56 AM Performed by: Orlie Dakin, CRNA Pre-anesthesia Checklist: Patient identified, Emergency Drugs available, Suction available and Patient being monitored Patient Re-evaluated:Patient Re-evaluated prior to induction Oxygen Delivery Method: Non-rebreather mask Induction Type: IV induction Placement Confirmation: positive ETCO2

## 2021-01-29 NOTE — Transfer of Care (Signed)
Immediate Anesthesia Transfer of Care Note  Patient: Taylor Shields  Procedure(s) Performed: COLONOSCOPY WITH PROPOFOL POLYPECTOMY INTESTINAL  Patient Location: Endoscopy Unit  Anesthesia Type:General  Level of Consciousness: awake  Airway & Oxygen Therapy: Patient Spontanous Breathing  Post-op Assessment: Report given to RN and Post -op Vital signs reviewed and stable  Post vital signs: Reviewed and stable  Last Vitals:  Vitals Value Taken Time  BP    Temp    Pulse    Resp    SpO2      Last Pain:  Vitals:   01/29/21 0744  TempSrc:   PainSc: 0-No pain      Patients Stated Pain Goal: 8 (82/06/01 5615)  Complications: No notable events documented.

## 2021-01-29 NOTE — Op Note (Signed)
[facility] Patient Name: Taylor Shields Procedure Date: 01/29/2021 7:10 AM MRN: [id number] Date of Birth: October 24, 1950 Attending MD: Maylon Peppers ,  CSN: [id number] Age: 70 Admit Type: Outpatient Procedure:                Colonoscopy Indications:              Screening for colorectal malignant neoplasm Providers:                Maylon Peppers, Crystal Page, Nelma Rothman,                            Technician Referring MD:              Medicines:                Monitored Anesthesia Care Complications:            No immediate complications. Estimated Blood Loss:     Estimated blood loss: none. Procedure:                Pre-Anesthesia Assessment:                           - Prior to the procedure, a History and Physical                            was performed, and patient medications, allergies                            and sensitivities were reviewed. The patient's                            tolerance of previous anesthesia was reviewed.                           - The risks and benefits of the procedure and the                            sedation options and risks were discussed with the                            patient. All questions were answered and informed                            consent was obtained.                           - ASA Grade Assessment: III - A patient with severe                            systemic disease.                           After obtaining informed consent, the colonoscope                            was passed under direct vision. Throughout the  procedure, the patient's blood pressure, pulse, and                            oxygen saturations were monitored continuously. The                            PCF-HQ190L ([id number]) scope was introduced through                            the anus and advanced to the the cecum, identified                            by appendiceal orifice and ileocecal valve. The                             colonoscopy was performed without difficulty. The                            patient tolerated the procedure well. The quality                            of the bowel preparation was adequate to identify                            polyps 6 mm and larger in size. Scope In: 7:51:37 AM Scope Out: 8:23:04 AM Scope Withdrawal Time: 0 hours 24 minutes 58 seconds  Total Procedure Duration: 0 hours 31 minutes 27 seconds  Findings:      The perianal exam findings include rectal prolapse.      Two sessile polyps were found in the ascending colon. The polyps were 4       to 12 mm in size. These polyps were removed with a cold snare. Resection       and retrieval were complete.      A 2 mm polyp was found in the ascending colon. The polyp was sessile.       The polyp was removed with a cold biopsy forceps. Resection and       retrieval were complete.      A large scar was found in the descending colon. The scar tissue was       healthy in appearance.      Multiple small and large-mouthed diverticula were found in the sigmoid       colon, descending colon, transverse colon and ascending colon.      The retroflexed view of the distal rectum and anal verge was normal and       showed no anal or rectal abnormalities. Impression:               - Rectal prolapse found on perianal exam.                           - Two 4 to 12 mm polyps in the ascending colon,                            removed with a cold snare. Resected and retrieved.                           -  One 2 mm polyp in the ascending colon, removed                            with a cold biopsy forceps. Resected and retrieved.                           - Scar in the descending colon.                           - Diverticulosis in the sigmoid colon, in the                            descending colon, in the transverse colon and in                            the ascending colon.                           - The distal rectum and anal  verge are normal on                            retroflexion view. Moderate Sedation:      Per Anesthesia Care Recommendation:           - Discharge patient to home (ambulatory).                           - Resume previous diet.                           - Await pathology results.                           - Repeat colonoscopy for surveillance based on                            pathology results. Will need a two day prep. Procedure Code(s):        --- Professional ---                           [phone], Colonoscopy, flexible; with removal of                            tumor(s), polyp(s), or other lesion(s) by snare                            technique                           83151, 83, Colonoscopy, flexible; with biopsy,                            single or multiple Diagnosis Code(s):        --- Professional ---  K63.5, Polyp of colon                           Z12.11, Encounter for screening for malignant                            neoplasm of colon                           K63.89, Other specified diseases of intestine                           K57.30, Diverticulosis of large intestine without                            perforation or abscess without bleeding CPT copyright 2019 American Medical Association. All rights reserved. The codes documented in this report are preliminary and upon coder review may  be revised to meet current compliance requirements. Maylon Peppers, MD Maylon Peppers,  01/29/2021 8:30:22 AM This report has been signed electronically. Number of Addenda: 0

## 2021-01-30 DIAGNOSIS — G8929 Other chronic pain: Secondary | ICD-10-CM | POA: Diagnosis not present

## 2021-01-30 DIAGNOSIS — E039 Hypothyroidism, unspecified: Secondary | ICD-10-CM | POA: Diagnosis not present

## 2021-01-30 DIAGNOSIS — E119 Type 2 diabetes mellitus without complications: Secondary | ICD-10-CM | POA: Diagnosis not present

## 2021-01-30 DIAGNOSIS — I509 Heart failure, unspecified: Secondary | ICD-10-CM | POA: Diagnosis not present

## 2021-01-30 DIAGNOSIS — J449 Chronic obstructive pulmonary disease, unspecified: Secondary | ICD-10-CM | POA: Diagnosis not present

## 2021-01-30 DIAGNOSIS — I110 Hypertensive heart disease with heart failure: Secondary | ICD-10-CM | POA: Diagnosis not present

## 2021-01-30 DIAGNOSIS — Z7983 Long term (current) use of bisphosphonates: Secondary | ICD-10-CM | POA: Diagnosis not present

## 2021-01-30 DIAGNOSIS — Z7984 Long term (current) use of oral hypoglycemic drugs: Secondary | ICD-10-CM | POA: Diagnosis not present

## 2021-01-30 DIAGNOSIS — I4891 Unspecified atrial fibrillation: Secondary | ICD-10-CM | POA: Diagnosis not present

## 2021-01-30 DIAGNOSIS — Z9981 Dependence on supplemental oxygen: Secondary | ICD-10-CM | POA: Diagnosis not present

## 2021-01-30 DIAGNOSIS — E785 Hyperlipidemia, unspecified: Secondary | ICD-10-CM | POA: Diagnosis not present

## 2021-01-30 DIAGNOSIS — Z9181 History of falling: Secondary | ICD-10-CM | POA: Diagnosis not present

## 2021-01-30 DIAGNOSIS — M545 Low back pain, unspecified: Secondary | ICD-10-CM | POA: Diagnosis not present

## 2021-01-30 LAB — SURGICAL PATHOLOGY

## 2021-02-03 ENCOUNTER — Encounter (INDEPENDENT_AMBULATORY_CARE_PROVIDER_SITE_OTHER): Payer: Self-pay | Admitting: *Deleted

## 2021-02-04 ENCOUNTER — Encounter (HOSPITAL_COMMUNITY): Payer: Self-pay | Admitting: Gastroenterology

## 2021-02-04 DIAGNOSIS — I4891 Unspecified atrial fibrillation: Secondary | ICD-10-CM | POA: Diagnosis not present

## 2021-02-04 DIAGNOSIS — I872 Venous insufficiency (chronic) (peripheral): Secondary | ICD-10-CM | POA: Diagnosis not present

## 2021-02-04 DIAGNOSIS — J449 Chronic obstructive pulmonary disease, unspecified: Secondary | ICD-10-CM | POA: Diagnosis not present

## 2021-02-04 DIAGNOSIS — I110 Hypertensive heart disease with heart failure: Secondary | ICD-10-CM | POA: Diagnosis not present

## 2021-02-04 DIAGNOSIS — E1151 Type 2 diabetes mellitus with diabetic peripheral angiopathy without gangrene: Secondary | ICD-10-CM | POA: Diagnosis not present

## 2021-02-04 DIAGNOSIS — L97212 Non-pressure chronic ulcer of right calf with fat layer exposed: Secondary | ICD-10-CM | POA: Diagnosis not present

## 2021-02-04 DIAGNOSIS — E119 Type 2 diabetes mellitus without complications: Secondary | ICD-10-CM | POA: Diagnosis not present

## 2021-02-04 DIAGNOSIS — I509 Heart failure, unspecified: Secondary | ICD-10-CM | POA: Diagnosis not present

## 2021-02-07 DIAGNOSIS — E119 Type 2 diabetes mellitus without complications: Secondary | ICD-10-CM | POA: Diagnosis not present

## 2021-02-07 DIAGNOSIS — I509 Heart failure, unspecified: Secondary | ICD-10-CM | POA: Diagnosis not present

## 2021-02-07 DIAGNOSIS — I110 Hypertensive heart disease with heart failure: Secondary | ICD-10-CM | POA: Diagnosis not present

## 2021-02-07 DIAGNOSIS — I4891 Unspecified atrial fibrillation: Secondary | ICD-10-CM | POA: Diagnosis not present

## 2021-02-07 DIAGNOSIS — J449 Chronic obstructive pulmonary disease, unspecified: Secondary | ICD-10-CM | POA: Diagnosis not present

## 2021-02-10 DIAGNOSIS — I4891 Unspecified atrial fibrillation: Secondary | ICD-10-CM | POA: Diagnosis not present

## 2021-02-10 DIAGNOSIS — I509 Heart failure, unspecified: Secondary | ICD-10-CM | POA: Diagnosis not present

## 2021-02-10 DIAGNOSIS — J449 Chronic obstructive pulmonary disease, unspecified: Secondary | ICD-10-CM | POA: Diagnosis not present

## 2021-02-10 DIAGNOSIS — I110 Hypertensive heart disease with heart failure: Secondary | ICD-10-CM | POA: Diagnosis not present

## 2021-02-10 DIAGNOSIS — E119 Type 2 diabetes mellitus without complications: Secondary | ICD-10-CM | POA: Diagnosis not present

## 2021-02-18 DIAGNOSIS — E119 Type 2 diabetes mellitus without complications: Secondary | ICD-10-CM | POA: Diagnosis not present

## 2021-02-18 DIAGNOSIS — I110 Hypertensive heart disease with heart failure: Secondary | ICD-10-CM | POA: Diagnosis not present

## 2021-02-18 DIAGNOSIS — I509 Heart failure, unspecified: Secondary | ICD-10-CM | POA: Diagnosis not present

## 2021-02-18 DIAGNOSIS — I4891 Unspecified atrial fibrillation: Secondary | ICD-10-CM | POA: Diagnosis not present

## 2021-02-18 DIAGNOSIS — J449 Chronic obstructive pulmonary disease, unspecified: Secondary | ICD-10-CM | POA: Diagnosis not present

## 2021-02-21 DIAGNOSIS — I509 Heart failure, unspecified: Secondary | ICD-10-CM | POA: Diagnosis not present

## 2021-02-21 DIAGNOSIS — I4891 Unspecified atrial fibrillation: Secondary | ICD-10-CM | POA: Diagnosis not present

## 2021-02-21 DIAGNOSIS — E119 Type 2 diabetes mellitus without complications: Secondary | ICD-10-CM | POA: Diagnosis not present

## 2021-02-21 DIAGNOSIS — I110 Hypertensive heart disease with heart failure: Secondary | ICD-10-CM | POA: Diagnosis not present

## 2021-02-21 DIAGNOSIS — J449 Chronic obstructive pulmonary disease, unspecified: Secondary | ICD-10-CM | POA: Diagnosis not present

## 2021-02-23 DIAGNOSIS — R062 Wheezing: Secondary | ICD-10-CM | POA: Diagnosis not present

## 2021-02-23 DIAGNOSIS — R059 Cough, unspecified: Secondary | ICD-10-CM | POA: Diagnosis not present

## 2021-02-23 DIAGNOSIS — J45909 Unspecified asthma, uncomplicated: Secondary | ICD-10-CM | POA: Diagnosis not present

## 2021-02-26 DIAGNOSIS — I110 Hypertensive heart disease with heart failure: Secondary | ICD-10-CM | POA: Diagnosis not present

## 2021-02-26 DIAGNOSIS — I509 Heart failure, unspecified: Secondary | ICD-10-CM | POA: Diagnosis not present

## 2021-02-26 DIAGNOSIS — I4891 Unspecified atrial fibrillation: Secondary | ICD-10-CM | POA: Diagnosis not present

## 2021-02-26 DIAGNOSIS — J449 Chronic obstructive pulmonary disease, unspecified: Secondary | ICD-10-CM | POA: Diagnosis not present

## 2021-02-26 DIAGNOSIS — E119 Type 2 diabetes mellitus without complications: Secondary | ICD-10-CM | POA: Diagnosis not present

## 2021-03-05 DIAGNOSIS — I110 Hypertensive heart disease with heart failure: Secondary | ICD-10-CM | POA: Diagnosis not present

## 2021-03-05 DIAGNOSIS — J449 Chronic obstructive pulmonary disease, unspecified: Secondary | ICD-10-CM | POA: Diagnosis not present

## 2021-03-05 DIAGNOSIS — I4891 Unspecified atrial fibrillation: Secondary | ICD-10-CM | POA: Diagnosis not present

## 2021-03-05 DIAGNOSIS — I509 Heart failure, unspecified: Secondary | ICD-10-CM | POA: Diagnosis not present

## 2021-03-05 DIAGNOSIS — E119 Type 2 diabetes mellitus without complications: Secondary | ICD-10-CM | POA: Diagnosis not present

## 2021-03-12 DIAGNOSIS — I110 Hypertensive heart disease with heart failure: Secondary | ICD-10-CM | POA: Diagnosis not present

## 2021-03-12 DIAGNOSIS — N182 Chronic kidney disease, stage 2 (mild): Secondary | ICD-10-CM | POA: Diagnosis not present

## 2021-03-12 DIAGNOSIS — I4891 Unspecified atrial fibrillation: Secondary | ICD-10-CM | POA: Diagnosis not present

## 2021-03-12 DIAGNOSIS — I10 Essential (primary) hypertension: Secondary | ICD-10-CM | POA: Diagnosis not present

## 2021-03-12 DIAGNOSIS — E1121 Type 2 diabetes mellitus with diabetic nephropathy: Secondary | ICD-10-CM | POA: Diagnosis not present

## 2021-03-12 DIAGNOSIS — E119 Type 2 diabetes mellitus without complications: Secondary | ICD-10-CM | POA: Diagnosis not present

## 2021-03-12 DIAGNOSIS — I509 Heart failure, unspecified: Secondary | ICD-10-CM | POA: Diagnosis not present

## 2021-03-12 DIAGNOSIS — J449 Chronic obstructive pulmonary disease, unspecified: Secondary | ICD-10-CM | POA: Diagnosis not present

## 2021-03-12 DIAGNOSIS — E7849 Other hyperlipidemia: Secondary | ICD-10-CM | POA: Diagnosis not present

## 2021-03-19 DIAGNOSIS — L97212 Non-pressure chronic ulcer of right calf with fat layer exposed: Secondary | ICD-10-CM | POA: Diagnosis not present

## 2021-03-19 DIAGNOSIS — G8929 Other chronic pain: Secondary | ICD-10-CM | POA: Diagnosis not present

## 2021-03-19 DIAGNOSIS — Z9981 Dependence on supplemental oxygen: Secondary | ICD-10-CM | POA: Diagnosis not present

## 2021-03-19 DIAGNOSIS — I872 Venous insufficiency (chronic) (peripheral): Secondary | ICD-10-CM | POA: Diagnosis not present

## 2021-03-19 DIAGNOSIS — Z9181 History of falling: Secondary | ICD-10-CM | POA: Diagnosis not present

## 2021-03-19 DIAGNOSIS — M109 Gout, unspecified: Secondary | ICD-10-CM | POA: Diagnosis not present

## 2021-03-19 DIAGNOSIS — E1151 Type 2 diabetes mellitus with diabetic peripheral angiopathy without gangrene: Secondary | ICD-10-CM | POA: Diagnosis not present

## 2021-03-19 DIAGNOSIS — Z7983 Long term (current) use of bisphosphonates: Secondary | ICD-10-CM | POA: Diagnosis not present

## 2021-03-19 DIAGNOSIS — J449 Chronic obstructive pulmonary disease, unspecified: Secondary | ICD-10-CM | POA: Diagnosis not present

## 2021-03-19 DIAGNOSIS — I4891 Unspecified atrial fibrillation: Secondary | ICD-10-CM | POA: Diagnosis not present

## 2021-03-19 DIAGNOSIS — M5134 Other intervertebral disc degeneration, thoracic region: Secondary | ICD-10-CM | POA: Diagnosis not present

## 2021-03-19 DIAGNOSIS — E039 Hypothyroidism, unspecified: Secondary | ICD-10-CM | POA: Diagnosis not present

## 2021-03-19 DIAGNOSIS — Z7984 Long term (current) use of oral hypoglycemic drugs: Secondary | ICD-10-CM | POA: Diagnosis not present

## 2021-03-19 DIAGNOSIS — I509 Heart failure, unspecified: Secondary | ICD-10-CM | POA: Diagnosis not present

## 2021-03-19 DIAGNOSIS — I110 Hypertensive heart disease with heart failure: Secondary | ICD-10-CM | POA: Diagnosis not present

## 2021-03-19 DIAGNOSIS — E785 Hyperlipidemia, unspecified: Secondary | ICD-10-CM | POA: Diagnosis not present

## 2021-03-19 DIAGNOSIS — K449 Diaphragmatic hernia without obstruction or gangrene: Secondary | ICD-10-CM | POA: Diagnosis not present

## 2021-03-19 DIAGNOSIS — M5136 Other intervertebral disc degeneration, lumbar region: Secondary | ICD-10-CM | POA: Diagnosis not present

## 2021-03-19 DIAGNOSIS — M545 Low back pain, unspecified: Secondary | ICD-10-CM | POA: Diagnosis not present

## 2021-03-25 DIAGNOSIS — E1140 Type 2 diabetes mellitus with diabetic neuropathy, unspecified: Secondary | ICD-10-CM | POA: Diagnosis not present

## 2021-03-25 DIAGNOSIS — M79675 Pain in left toe(s): Secondary | ICD-10-CM | POA: Diagnosis not present

## 2021-03-25 DIAGNOSIS — M79672 Pain in left foot: Secondary | ICD-10-CM | POA: Diagnosis not present

## 2021-03-25 DIAGNOSIS — I739 Peripheral vascular disease, unspecified: Secondary | ICD-10-CM | POA: Diagnosis not present

## 2021-03-25 DIAGNOSIS — M79671 Pain in right foot: Secondary | ICD-10-CM | POA: Diagnosis not present

## 2021-03-25 DIAGNOSIS — M79674 Pain in right toe(s): Secondary | ICD-10-CM | POA: Diagnosis not present

## 2021-03-26 DIAGNOSIS — R059 Cough, unspecified: Secondary | ICD-10-CM | POA: Diagnosis not present

## 2021-03-26 DIAGNOSIS — E1151 Type 2 diabetes mellitus with diabetic peripheral angiopathy without gangrene: Secondary | ICD-10-CM | POA: Diagnosis not present

## 2021-03-26 DIAGNOSIS — E039 Hypothyroidism, unspecified: Secondary | ICD-10-CM | POA: Diagnosis not present

## 2021-03-26 DIAGNOSIS — M5134 Other intervertebral disc degeneration, thoracic region: Secondary | ICD-10-CM | POA: Diagnosis not present

## 2021-03-26 DIAGNOSIS — E785 Hyperlipidemia, unspecified: Secondary | ICD-10-CM | POA: Diagnosis not present

## 2021-03-26 DIAGNOSIS — M545 Low back pain, unspecified: Secondary | ICD-10-CM | POA: Diagnosis not present

## 2021-03-26 DIAGNOSIS — I110 Hypertensive heart disease with heart failure: Secondary | ICD-10-CM | POA: Diagnosis not present

## 2021-03-26 DIAGNOSIS — Z9981 Dependence on supplemental oxygen: Secondary | ICD-10-CM | POA: Diagnosis not present

## 2021-03-26 DIAGNOSIS — Z7983 Long term (current) use of bisphosphonates: Secondary | ICD-10-CM | POA: Diagnosis not present

## 2021-03-26 DIAGNOSIS — L97212 Non-pressure chronic ulcer of right calf with fat layer exposed: Secondary | ICD-10-CM | POA: Diagnosis not present

## 2021-03-26 DIAGNOSIS — I509 Heart failure, unspecified: Secondary | ICD-10-CM | POA: Diagnosis not present

## 2021-03-26 DIAGNOSIS — I872 Venous insufficiency (chronic) (peripheral): Secondary | ICD-10-CM | POA: Diagnosis not present

## 2021-03-26 DIAGNOSIS — G8929 Other chronic pain: Secondary | ICD-10-CM | POA: Diagnosis not present

## 2021-03-26 DIAGNOSIS — Z7984 Long term (current) use of oral hypoglycemic drugs: Secondary | ICD-10-CM | POA: Diagnosis not present

## 2021-03-26 DIAGNOSIS — Z9181 History of falling: Secondary | ICD-10-CM | POA: Diagnosis not present

## 2021-03-26 DIAGNOSIS — I4891 Unspecified atrial fibrillation: Secondary | ICD-10-CM | POA: Diagnosis not present

## 2021-03-26 DIAGNOSIS — J449 Chronic obstructive pulmonary disease, unspecified: Secondary | ICD-10-CM | POA: Diagnosis not present

## 2021-03-26 DIAGNOSIS — M5136 Other intervertebral disc degeneration, lumbar region: Secondary | ICD-10-CM | POA: Diagnosis not present

## 2021-03-26 DIAGNOSIS — J45909 Unspecified asthma, uncomplicated: Secondary | ICD-10-CM | POA: Diagnosis not present

## 2021-03-26 DIAGNOSIS — M109 Gout, unspecified: Secondary | ICD-10-CM | POA: Diagnosis not present

## 2021-03-26 DIAGNOSIS — K449 Diaphragmatic hernia without obstruction or gangrene: Secondary | ICD-10-CM | POA: Diagnosis not present

## 2021-03-26 DIAGNOSIS — R062 Wheezing: Secondary | ICD-10-CM | POA: Diagnosis not present

## 2021-03-27 DIAGNOSIS — I509 Heart failure, unspecified: Secondary | ICD-10-CM | POA: Diagnosis not present

## 2021-03-27 DIAGNOSIS — M545 Low back pain, unspecified: Secondary | ICD-10-CM | POA: Diagnosis not present

## 2021-03-27 DIAGNOSIS — I110 Hypertensive heart disease with heart failure: Secondary | ICD-10-CM | POA: Diagnosis not present

## 2021-03-27 DIAGNOSIS — M109 Gout, unspecified: Secondary | ICD-10-CM | POA: Diagnosis not present

## 2021-03-27 DIAGNOSIS — I872 Venous insufficiency (chronic) (peripheral): Secondary | ICD-10-CM | POA: Diagnosis not present

## 2021-03-27 DIAGNOSIS — Z9981 Dependence on supplemental oxygen: Secondary | ICD-10-CM | POA: Diagnosis not present

## 2021-03-27 DIAGNOSIS — Z9181 History of falling: Secondary | ICD-10-CM | POA: Diagnosis not present

## 2021-03-27 DIAGNOSIS — M5136 Other intervertebral disc degeneration, lumbar region: Secondary | ICD-10-CM | POA: Diagnosis not present

## 2021-03-27 DIAGNOSIS — I4891 Unspecified atrial fibrillation: Secondary | ICD-10-CM | POA: Diagnosis not present

## 2021-03-27 DIAGNOSIS — G8929 Other chronic pain: Secondary | ICD-10-CM | POA: Diagnosis not present

## 2021-03-27 DIAGNOSIS — Z7984 Long term (current) use of oral hypoglycemic drugs: Secondary | ICD-10-CM | POA: Diagnosis not present

## 2021-03-27 DIAGNOSIS — Z7983 Long term (current) use of bisphosphonates: Secondary | ICD-10-CM | POA: Diagnosis not present

## 2021-03-27 DIAGNOSIS — J449 Chronic obstructive pulmonary disease, unspecified: Secondary | ICD-10-CM | POA: Diagnosis not present

## 2021-03-27 DIAGNOSIS — E785 Hyperlipidemia, unspecified: Secondary | ICD-10-CM | POA: Diagnosis not present

## 2021-03-27 DIAGNOSIS — K449 Diaphragmatic hernia without obstruction or gangrene: Secondary | ICD-10-CM | POA: Diagnosis not present

## 2021-03-27 DIAGNOSIS — E039 Hypothyroidism, unspecified: Secondary | ICD-10-CM | POA: Diagnosis not present

## 2021-03-27 DIAGNOSIS — L97212 Non-pressure chronic ulcer of right calf with fat layer exposed: Secondary | ICD-10-CM | POA: Diagnosis not present

## 2021-03-27 DIAGNOSIS — E1151 Type 2 diabetes mellitus with diabetic peripheral angiopathy without gangrene: Secondary | ICD-10-CM | POA: Diagnosis not present

## 2021-03-27 DIAGNOSIS — M5134 Other intervertebral disc degeneration, thoracic region: Secondary | ICD-10-CM | POA: Diagnosis not present

## 2021-04-02 DIAGNOSIS — M109 Gout, unspecified: Secondary | ICD-10-CM | POA: Diagnosis not present

## 2021-04-02 DIAGNOSIS — K449 Diaphragmatic hernia without obstruction or gangrene: Secondary | ICD-10-CM | POA: Diagnosis not present

## 2021-04-02 DIAGNOSIS — G8929 Other chronic pain: Secondary | ICD-10-CM | POA: Diagnosis not present

## 2021-04-02 DIAGNOSIS — Z7983 Long term (current) use of bisphosphonates: Secondary | ICD-10-CM | POA: Diagnosis not present

## 2021-04-02 DIAGNOSIS — Z9981 Dependence on supplemental oxygen: Secondary | ICD-10-CM | POA: Diagnosis not present

## 2021-04-02 DIAGNOSIS — M545 Low back pain, unspecified: Secondary | ICD-10-CM | POA: Diagnosis not present

## 2021-04-02 DIAGNOSIS — E785 Hyperlipidemia, unspecified: Secondary | ICD-10-CM | POA: Diagnosis not present

## 2021-04-02 DIAGNOSIS — Z7984 Long term (current) use of oral hypoglycemic drugs: Secondary | ICD-10-CM | POA: Diagnosis not present

## 2021-04-02 DIAGNOSIS — L97212 Non-pressure chronic ulcer of right calf with fat layer exposed: Secondary | ICD-10-CM | POA: Diagnosis not present

## 2021-04-02 DIAGNOSIS — M5134 Other intervertebral disc degeneration, thoracic region: Secondary | ICD-10-CM | POA: Diagnosis not present

## 2021-04-02 DIAGNOSIS — E1151 Type 2 diabetes mellitus with diabetic peripheral angiopathy without gangrene: Secondary | ICD-10-CM | POA: Diagnosis not present

## 2021-04-02 DIAGNOSIS — M5136 Other intervertebral disc degeneration, lumbar region: Secondary | ICD-10-CM | POA: Diagnosis not present

## 2021-04-02 DIAGNOSIS — I509 Heart failure, unspecified: Secondary | ICD-10-CM | POA: Diagnosis not present

## 2021-04-02 DIAGNOSIS — E039 Hypothyroidism, unspecified: Secondary | ICD-10-CM | POA: Diagnosis not present

## 2021-04-02 DIAGNOSIS — I872 Venous insufficiency (chronic) (peripheral): Secondary | ICD-10-CM | POA: Diagnosis not present

## 2021-04-02 DIAGNOSIS — J449 Chronic obstructive pulmonary disease, unspecified: Secondary | ICD-10-CM | POA: Diagnosis not present

## 2021-04-02 DIAGNOSIS — Z9181 History of falling: Secondary | ICD-10-CM | POA: Diagnosis not present

## 2021-04-02 DIAGNOSIS — I110 Hypertensive heart disease with heart failure: Secondary | ICD-10-CM | POA: Diagnosis not present

## 2021-04-02 DIAGNOSIS — I4891 Unspecified atrial fibrillation: Secondary | ICD-10-CM | POA: Diagnosis not present

## 2021-04-08 DIAGNOSIS — I10 Essential (primary) hypertension: Secondary | ICD-10-CM | POA: Diagnosis not present

## 2021-04-08 DIAGNOSIS — J449 Chronic obstructive pulmonary disease, unspecified: Secondary | ICD-10-CM | POA: Diagnosis not present

## 2021-04-08 DIAGNOSIS — E7849 Other hyperlipidemia: Secondary | ICD-10-CM | POA: Diagnosis not present

## 2021-04-08 DIAGNOSIS — I872 Venous insufficiency (chronic) (peripheral): Secondary | ICD-10-CM | POA: Diagnosis not present

## 2021-04-08 DIAGNOSIS — E1121 Type 2 diabetes mellitus with diabetic nephropathy: Secondary | ICD-10-CM | POA: Diagnosis not present

## 2021-04-08 DIAGNOSIS — N182 Chronic kidney disease, stage 2 (mild): Secondary | ICD-10-CM | POA: Diagnosis not present

## 2021-04-08 DIAGNOSIS — M160 Bilateral primary osteoarthritis of hip: Secondary | ICD-10-CM | POA: Diagnosis not present

## 2021-04-08 DIAGNOSIS — M545 Low back pain, unspecified: Secondary | ICD-10-CM | POA: Diagnosis not present

## 2021-06-23 DIAGNOSIS — Z0000 Encounter for general adult medical examination without abnormal findings: Secondary | ICD-10-CM | POA: Diagnosis not present

## 2021-06-23 DIAGNOSIS — N182 Chronic kidney disease, stage 2 (mild): Secondary | ICD-10-CM | POA: Diagnosis not present

## 2021-06-23 DIAGNOSIS — E1121 Type 2 diabetes mellitus with diabetic nephropathy: Secondary | ICD-10-CM | POA: Diagnosis not present

## 2021-06-23 DIAGNOSIS — M160 Bilateral primary osteoarthritis of hip: Secondary | ICD-10-CM | POA: Diagnosis not present

## 2021-06-23 DIAGNOSIS — M545 Low back pain, unspecified: Secondary | ICD-10-CM | POA: Diagnosis not present

## 2021-06-23 DIAGNOSIS — E7849 Other hyperlipidemia: Secondary | ICD-10-CM | POA: Diagnosis not present

## 2021-06-23 DIAGNOSIS — J449 Chronic obstructive pulmonary disease, unspecified: Secondary | ICD-10-CM | POA: Diagnosis not present

## 2021-06-23 DIAGNOSIS — I10 Essential (primary) hypertension: Secondary | ICD-10-CM | POA: Diagnosis not present

## 2021-06-23 DIAGNOSIS — I872 Venous insufficiency (chronic) (peripheral): Secondary | ICD-10-CM | POA: Diagnosis not present

## 2021-06-24 DIAGNOSIS — M79674 Pain in right toe(s): Secondary | ICD-10-CM | POA: Diagnosis not present

## 2021-06-24 DIAGNOSIS — M79672 Pain in left foot: Secondary | ICD-10-CM | POA: Diagnosis not present

## 2021-06-24 DIAGNOSIS — I739 Peripheral vascular disease, unspecified: Secondary | ICD-10-CM | POA: Diagnosis not present

## 2021-06-24 DIAGNOSIS — M79675 Pain in left toe(s): Secondary | ICD-10-CM | POA: Diagnosis not present

## 2021-06-24 DIAGNOSIS — M79671 Pain in right foot: Secondary | ICD-10-CM | POA: Diagnosis not present

## 2021-06-24 DIAGNOSIS — E1140 Type 2 diabetes mellitus with diabetic neuropathy, unspecified: Secondary | ICD-10-CM | POA: Diagnosis not present

## 2021-08-18 DIAGNOSIS — I129 Hypertensive chronic kidney disease with stage 1 through stage 4 chronic kidney disease, or unspecified chronic kidney disease: Secondary | ICD-10-CM | POA: Diagnosis not present

## 2021-08-18 DIAGNOSIS — I482 Chronic atrial fibrillation, unspecified: Secondary | ICD-10-CM | POA: Diagnosis not present

## 2021-08-18 DIAGNOSIS — Z7984 Long term (current) use of oral hypoglycemic drugs: Secondary | ICD-10-CM | POA: Diagnosis not present

## 2021-08-18 DIAGNOSIS — J441 Chronic obstructive pulmonary disease with (acute) exacerbation: Secondary | ICD-10-CM | POA: Diagnosis not present

## 2021-08-18 DIAGNOSIS — Z743 Need for continuous supervision: Secondary | ICD-10-CM | POA: Diagnosis not present

## 2021-08-18 DIAGNOSIS — N182 Chronic kidney disease, stage 2 (mild): Secondary | ICD-10-CM | POA: Diagnosis not present

## 2021-08-18 DIAGNOSIS — Z9981 Dependence on supplemental oxygen: Secondary | ICD-10-CM | POA: Diagnosis not present

## 2021-08-18 DIAGNOSIS — Z880 Allergy status to penicillin: Secondary | ICD-10-CM | POA: Diagnosis not present

## 2021-08-18 DIAGNOSIS — R0989 Other specified symptoms and signs involving the circulatory and respiratory systems: Secondary | ICD-10-CM | POA: Diagnosis not present

## 2021-08-18 DIAGNOSIS — M549 Dorsalgia, unspecified: Secondary | ICD-10-CM | POA: Diagnosis not present

## 2021-08-18 DIAGNOSIS — N1832 Chronic kidney disease, stage 3b: Secondary | ICD-10-CM | POA: Diagnosis not present

## 2021-08-18 DIAGNOSIS — E785 Hyperlipidemia, unspecified: Secondary | ICD-10-CM | POA: Diagnosis not present

## 2021-08-18 DIAGNOSIS — E1122 Type 2 diabetes mellitus with diabetic chronic kidney disease: Secondary | ICD-10-CM | POA: Diagnosis not present

## 2021-08-18 DIAGNOSIS — Z20822 Contact with and (suspected) exposure to covid-19: Secondary | ICD-10-CM | POA: Diagnosis not present

## 2021-08-18 DIAGNOSIS — R059 Cough, unspecified: Secondary | ICD-10-CM | POA: Diagnosis not present

## 2021-08-18 DIAGNOSIS — I440 Atrioventricular block, first degree: Secondary | ICD-10-CM | POA: Diagnosis not present

## 2021-08-18 DIAGNOSIS — R062 Wheezing: Secondary | ICD-10-CM | POA: Diagnosis not present

## 2021-08-18 DIAGNOSIS — R6889 Other general symptoms and signs: Secondary | ICD-10-CM | POA: Diagnosis not present

## 2021-08-18 DIAGNOSIS — I77810 Thoracic aortic ectasia: Secondary | ICD-10-CM | POA: Diagnosis not present

## 2021-08-18 DIAGNOSIS — Z79899 Other long term (current) drug therapy: Secondary | ICD-10-CM | POA: Diagnosis not present

## 2021-08-18 DIAGNOSIS — Z8709 Personal history of other diseases of the respiratory system: Secondary | ICD-10-CM | POA: Diagnosis not present

## 2021-09-02 DIAGNOSIS — J449 Chronic obstructive pulmonary disease, unspecified: Secondary | ICD-10-CM | POA: Diagnosis not present

## 2021-09-16 DIAGNOSIS — I739 Peripheral vascular disease, unspecified: Secondary | ICD-10-CM | POA: Diagnosis not present

## 2021-09-16 DIAGNOSIS — M79674 Pain in right toe(s): Secondary | ICD-10-CM | POA: Diagnosis not present

## 2021-09-16 DIAGNOSIS — E1140 Type 2 diabetes mellitus with diabetic neuropathy, unspecified: Secondary | ICD-10-CM | POA: Diagnosis not present

## 2021-09-16 DIAGNOSIS — M79671 Pain in right foot: Secondary | ICD-10-CM | POA: Diagnosis not present

## 2021-09-16 DIAGNOSIS — M79672 Pain in left foot: Secondary | ICD-10-CM | POA: Diagnosis not present

## 2021-09-16 DIAGNOSIS — M79675 Pain in left toe(s): Secondary | ICD-10-CM | POA: Diagnosis not present

## 2021-10-01 DIAGNOSIS — Z0000 Encounter for general adult medical examination without abnormal findings: Secondary | ICD-10-CM | POA: Diagnosis not present

## 2021-10-01 DIAGNOSIS — J4550 Severe persistent asthma, uncomplicated: Secondary | ICD-10-CM | POA: Diagnosis not present

## 2021-10-01 DIAGNOSIS — J449 Chronic obstructive pulmonary disease, unspecified: Secondary | ICD-10-CM | POA: Diagnosis not present

## 2021-10-02 DIAGNOSIS — J4550 Severe persistent asthma, uncomplicated: Secondary | ICD-10-CM | POA: Diagnosis not present

## 2021-10-20 DIAGNOSIS — I872 Venous insufficiency (chronic) (peripheral): Secondary | ICD-10-CM | POA: Diagnosis not present

## 2021-10-20 DIAGNOSIS — Z0000 Encounter for general adult medical examination without abnormal findings: Secondary | ICD-10-CM | POA: Diagnosis not present

## 2021-10-20 DIAGNOSIS — J4550 Severe persistent asthma, uncomplicated: Secondary | ICD-10-CM | POA: Diagnosis not present

## 2021-10-20 DIAGNOSIS — J449 Chronic obstructive pulmonary disease, unspecified: Secondary | ICD-10-CM | POA: Diagnosis not present

## 2021-11-17 DIAGNOSIS — M5459 Other low back pain: Secondary | ICD-10-CM | POA: Diagnosis not present

## 2021-11-17 DIAGNOSIS — M25512 Pain in left shoulder: Secondary | ICD-10-CM | POA: Diagnosis not present

## 2021-12-16 DIAGNOSIS — M79674 Pain in right toe(s): Secondary | ICD-10-CM | POA: Diagnosis not present

## 2021-12-16 DIAGNOSIS — I739 Peripheral vascular disease, unspecified: Secondary | ICD-10-CM | POA: Diagnosis not present

## 2021-12-16 DIAGNOSIS — M79671 Pain in right foot: Secondary | ICD-10-CM | POA: Diagnosis not present

## 2021-12-16 DIAGNOSIS — M79672 Pain in left foot: Secondary | ICD-10-CM | POA: Diagnosis not present

## 2021-12-16 DIAGNOSIS — M79675 Pain in left toe(s): Secondary | ICD-10-CM | POA: Diagnosis not present

## 2021-12-16 DIAGNOSIS — E1140 Type 2 diabetes mellitus with diabetic neuropathy, unspecified: Secondary | ICD-10-CM | POA: Diagnosis not present

## 2022-01-06 DIAGNOSIS — I10 Essential (primary) hypertension: Secondary | ICD-10-CM | POA: Diagnosis not present

## 2022-01-06 DIAGNOSIS — M25512 Pain in left shoulder: Secondary | ICD-10-CM | POA: Diagnosis not present

## 2022-01-06 DIAGNOSIS — M5459 Other low back pain: Secondary | ICD-10-CM | POA: Diagnosis not present

## 2022-01-06 DIAGNOSIS — J449 Chronic obstructive pulmonary disease, unspecified: Secondary | ICD-10-CM | POA: Diagnosis not present

## 2022-01-06 DIAGNOSIS — E1169 Type 2 diabetes mellitus with other specified complication: Secondary | ICD-10-CM | POA: Diagnosis not present

## 2022-02-27 DIAGNOSIS — J4550 Severe persistent asthma, uncomplicated: Secondary | ICD-10-CM | POA: Diagnosis not present

## 2022-02-27 DIAGNOSIS — M160 Bilateral primary osteoarthritis of hip: Secondary | ICD-10-CM | POA: Diagnosis not present

## 2022-03-20 ENCOUNTER — Telehealth: Payer: Self-pay | Admitting: *Deleted

## 2022-03-20 NOTE — Patient Outreach (Signed)
  Care Coordination   03/20/2022 Name: SHERRIAN NUNNELLEY MRN: [id number] DOB: Dec 07, 1950   Care Coordination Outreach Attempts:  An unsuccessful telephone outreach was attempted today to offer the patient information about available care coordination services as a benefit of their health plan.   Follow Up Plan:  Additional outreach attempts will be made to offer the patient care coordination information and services.   Encounter Outcome:  No Answer  Care Coordination Interventions Activated:  No   Care Coordination Interventions:  No, not indicated    Valente David, RN, MSN, [facility] Care Management Care Management Coordinator [phone]

## 2022-03-24 DIAGNOSIS — M79672 Pain in left foot: Secondary | ICD-10-CM | POA: Diagnosis not present

## 2022-03-24 DIAGNOSIS — M79675 Pain in left toe(s): Secondary | ICD-10-CM | POA: Diagnosis not present

## 2022-03-24 DIAGNOSIS — I739 Peripheral vascular disease, unspecified: Secondary | ICD-10-CM | POA: Diagnosis not present

## 2022-03-24 DIAGNOSIS — M79671 Pain in right foot: Secondary | ICD-10-CM | POA: Diagnosis not present

## 2022-03-24 DIAGNOSIS — E1140 Type 2 diabetes mellitus with diabetic neuropathy, unspecified: Secondary | ICD-10-CM | POA: Diagnosis not present

## 2022-03-24 DIAGNOSIS — M79674 Pain in right toe(s): Secondary | ICD-10-CM | POA: Diagnosis not present

## 2022-03-25 ENCOUNTER — Telehealth: Payer: Self-pay | Admitting: *Deleted

## 2022-03-25 NOTE — Patient Outreach (Signed)
  Care Coordination   03/25/2022 Name: Taylor Shields MRN: [id number] DOB: 03/06/1951   Care Coordination Outreach Attempts:  A second unsuccessful outreach was attempted today to offer the patient with information about available care coordination services as a benefit of their health plan.     Follow Up Plan:  Additional outreach attempts will be made to offer the patient care coordination information and services.   Encounter Outcome:  No Answer  Care Coordination Interventions Activated:  No   Care Coordination Interventions:  No, not indicated    Valente David, RN, MSN, [facility] Care Management Care Management Coordinator [phone]

## 2022-03-30 ENCOUNTER — Telehealth: Payer: Self-pay | Admitting: *Deleted

## 2022-03-30 NOTE — Patient Outreach (Signed)
  Care Coordination   03/30/2022 Name: Taylor Shields MRN: [id number] DOB: Jan 30, 1951   Care Coordination Outreach Attempts:  A third unsuccessful outreach was attempted today to offer the patient with information about available care coordination services as a benefit of their health plan.   Follow Up Plan:  No further outreach attempts will be made at this time. We have been unable to contact the patient to offer or enroll patient in care coordination services  Encounter Outcome:  No Answer  Care Coordination Interventions Activated:  No   Care Coordination Interventions:  No, not indicated    Valente David, RN, MSN, [facility] Care Management Care Management Coordinator [phone]

## 2022-04-09 DIAGNOSIS — J301 Allergic rhinitis due to pollen: Secondary | ICD-10-CM | POA: Diagnosis not present

## 2022-04-09 DIAGNOSIS — E1169 Type 2 diabetes mellitus with other specified complication: Secondary | ICD-10-CM | POA: Diagnosis not present

## 2022-04-09 DIAGNOSIS — M5459 Other low back pain: Secondary | ICD-10-CM | POA: Diagnosis not present

## 2022-04-09 DIAGNOSIS — J449 Chronic obstructive pulmonary disease, unspecified: Secondary | ICD-10-CM | POA: Diagnosis not present

## 2022-04-09 DIAGNOSIS — M25512 Pain in left shoulder: Secondary | ICD-10-CM | POA: Diagnosis not present

## 2022-04-09 DIAGNOSIS — I10 Essential (primary) hypertension: Secondary | ICD-10-CM | POA: Diagnosis not present

## 2022-05-01 DIAGNOSIS — Z1231 Encounter for screening mammogram for malignant neoplasm of breast: Secondary | ICD-10-CM | POA: Diagnosis not present

## 2022-05-25 DIAGNOSIS — M810 Age-related osteoporosis without current pathological fracture: Secondary | ICD-10-CM | POA: Diagnosis not present

## 2022-05-25 DIAGNOSIS — M8589 Other specified disorders of bone density and structure, multiple sites: Secondary | ICD-10-CM | POA: Diagnosis not present

## 2022-07-09 DIAGNOSIS — J301 Allergic rhinitis due to pollen: Secondary | ICD-10-CM | POA: Diagnosis not present

## 2022-07-09 DIAGNOSIS — E0843 Diabetes mellitus due to underlying condition with diabetic autonomic (poly)neuropathy: Secondary | ICD-10-CM | POA: Diagnosis not present

## 2022-07-09 DIAGNOSIS — E1143 Type 2 diabetes mellitus with diabetic autonomic (poly)neuropathy: Secondary | ICD-10-CM | POA: Diagnosis not present

## 2022-07-09 DIAGNOSIS — I10 Essential (primary) hypertension: Secondary | ICD-10-CM | POA: Diagnosis not present

## 2022-07-09 DIAGNOSIS — J449 Chronic obstructive pulmonary disease, unspecified: Secondary | ICD-10-CM | POA: Diagnosis not present

## 2022-07-09 DIAGNOSIS — M25512 Pain in left shoulder: Secondary | ICD-10-CM | POA: Diagnosis not present

## 2022-07-09 DIAGNOSIS — M5459 Other low back pain: Secondary | ICD-10-CM | POA: Diagnosis not present

## 2022-07-20 DIAGNOSIS — M79671 Pain in right foot: Secondary | ICD-10-CM | POA: Diagnosis not present

## 2022-07-20 DIAGNOSIS — M79672 Pain in left foot: Secondary | ICD-10-CM | POA: Diagnosis not present

## 2022-07-20 DIAGNOSIS — M79674 Pain in right toe(s): Secondary | ICD-10-CM | POA: Diagnosis not present

## 2022-07-20 DIAGNOSIS — M79675 Pain in left toe(s): Secondary | ICD-10-CM | POA: Diagnosis not present

## 2022-07-20 DIAGNOSIS — E1140 Type 2 diabetes mellitus with diabetic neuropathy, unspecified: Secondary | ICD-10-CM | POA: Diagnosis not present

## 2022-07-20 DIAGNOSIS — I739 Peripheral vascular disease, unspecified: Secondary | ICD-10-CM | POA: Diagnosis not present

## 2022-10-06 DIAGNOSIS — M25512 Pain in left shoulder: Secondary | ICD-10-CM | POA: Diagnosis not present

## 2022-10-06 DIAGNOSIS — J449 Chronic obstructive pulmonary disease, unspecified: Secondary | ICD-10-CM | POA: Diagnosis not present

## 2022-10-06 DIAGNOSIS — J301 Allergic rhinitis due to pollen: Secondary | ICD-10-CM | POA: Diagnosis not present

## 2022-10-06 DIAGNOSIS — I10 Essential (primary) hypertension: Secondary | ICD-10-CM | POA: Diagnosis not present

## 2022-10-06 DIAGNOSIS — R296 Repeated falls: Secondary | ICD-10-CM | POA: Diagnosis not present

## 2022-10-06 DIAGNOSIS — Z6829 Body mass index (BMI) 29.0-29.9, adult: Secondary | ICD-10-CM | POA: Diagnosis not present

## 2022-10-06 DIAGNOSIS — E1143 Type 2 diabetes mellitus with diabetic autonomic (poly)neuropathy: Secondary | ICD-10-CM | POA: Diagnosis not present

## 2022-10-06 DIAGNOSIS — N1832 Chronic kidney disease, stage 3b: Secondary | ICD-10-CM | POA: Diagnosis not present

## 2022-10-06 DIAGNOSIS — M5459 Other low back pain: Secondary | ICD-10-CM | POA: Diagnosis not present

## 2022-10-15 DIAGNOSIS — I129 Hypertensive chronic kidney disease with stage 1 through stage 4 chronic kidney disease, or unspecified chronic kidney disease: Secondary | ICD-10-CM | POA: Diagnosis not present

## 2022-10-15 DIAGNOSIS — Z9981 Dependence on supplemental oxygen: Secondary | ICD-10-CM | POA: Diagnosis not present

## 2022-10-15 DIAGNOSIS — E1143 Type 2 diabetes mellitus with diabetic autonomic (poly)neuropathy: Secondary | ICD-10-CM | POA: Diagnosis not present

## 2022-10-15 DIAGNOSIS — J441 Chronic obstructive pulmonary disease with (acute) exacerbation: Secondary | ICD-10-CM | POA: Diagnosis not present

## 2022-10-15 DIAGNOSIS — Z87891 Personal history of nicotine dependence: Secondary | ICD-10-CM | POA: Diagnosis not present

## 2022-10-15 DIAGNOSIS — N1832 Chronic kidney disease, stage 3b: Secondary | ICD-10-CM | POA: Diagnosis not present

## 2022-10-15 DIAGNOSIS — J301 Allergic rhinitis due to pollen: Secondary | ICD-10-CM | POA: Diagnosis not present

## 2022-10-15 DIAGNOSIS — E1122 Type 2 diabetes mellitus with diabetic chronic kidney disease: Secondary | ICD-10-CM | POA: Diagnosis not present

## 2022-10-15 DIAGNOSIS — Z7984 Long term (current) use of oral hypoglycemic drugs: Secondary | ICD-10-CM | POA: Diagnosis not present

## 2022-10-26 DIAGNOSIS — I10 Essential (primary) hypertension: Secondary | ICD-10-CM | POA: Diagnosis not present

## 2022-10-26 DIAGNOSIS — Z6829 Body mass index (BMI) 29.0-29.9, adult: Secondary | ICD-10-CM | POA: Diagnosis not present

## 2022-10-26 DIAGNOSIS — J440 Chronic obstructive pulmonary disease with acute lower respiratory infection: Secondary | ICD-10-CM | POA: Diagnosis not present

## 2022-10-30 DIAGNOSIS — J301 Allergic rhinitis due to pollen: Secondary | ICD-10-CM | POA: Diagnosis not present

## 2022-10-30 DIAGNOSIS — E1143 Type 2 diabetes mellitus with diabetic autonomic (poly)neuropathy: Secondary | ICD-10-CM | POA: Diagnosis not present

## 2022-10-30 DIAGNOSIS — Z9981 Dependence on supplemental oxygen: Secondary | ICD-10-CM | POA: Diagnosis not present

## 2022-10-30 DIAGNOSIS — Z87891 Personal history of nicotine dependence: Secondary | ICD-10-CM | POA: Diagnosis not present

## 2022-10-30 DIAGNOSIS — J441 Chronic obstructive pulmonary disease with (acute) exacerbation: Secondary | ICD-10-CM | POA: Diagnosis not present

## 2022-10-30 DIAGNOSIS — I129 Hypertensive chronic kidney disease with stage 1 through stage 4 chronic kidney disease, or unspecified chronic kidney disease: Secondary | ICD-10-CM | POA: Diagnosis not present

## 2022-10-30 DIAGNOSIS — E1122 Type 2 diabetes mellitus with diabetic chronic kidney disease: Secondary | ICD-10-CM | POA: Diagnosis not present

## 2022-10-30 DIAGNOSIS — N1832 Chronic kidney disease, stage 3b: Secondary | ICD-10-CM | POA: Diagnosis not present

## 2022-10-30 DIAGNOSIS — Z7984 Long term (current) use of oral hypoglycemic drugs: Secondary | ICD-10-CM | POA: Diagnosis not present

## 2022-11-04 DIAGNOSIS — Z7984 Long term (current) use of oral hypoglycemic drugs: Secondary | ICD-10-CM | POA: Diagnosis not present

## 2022-11-04 DIAGNOSIS — Z87891 Personal history of nicotine dependence: Secondary | ICD-10-CM | POA: Diagnosis not present

## 2022-11-04 DIAGNOSIS — J441 Chronic obstructive pulmonary disease with (acute) exacerbation: Secondary | ICD-10-CM | POA: Diagnosis not present

## 2022-11-04 DIAGNOSIS — Z9981 Dependence on supplemental oxygen: Secondary | ICD-10-CM | POA: Diagnosis not present

## 2022-11-04 DIAGNOSIS — E1143 Type 2 diabetes mellitus with diabetic autonomic (poly)neuropathy: Secondary | ICD-10-CM | POA: Diagnosis not present

## 2022-11-04 DIAGNOSIS — J301 Allergic rhinitis due to pollen: Secondary | ICD-10-CM | POA: Diagnosis not present

## 2022-11-04 DIAGNOSIS — N1832 Chronic kidney disease, stage 3b: Secondary | ICD-10-CM | POA: Diagnosis not present

## 2022-11-04 DIAGNOSIS — E1122 Type 2 diabetes mellitus with diabetic chronic kidney disease: Secondary | ICD-10-CM | POA: Diagnosis not present

## 2022-11-04 DIAGNOSIS — I129 Hypertensive chronic kidney disease with stage 1 through stage 4 chronic kidney disease, or unspecified chronic kidney disease: Secondary | ICD-10-CM | POA: Diagnosis not present

## 2022-11-06 DIAGNOSIS — I129 Hypertensive chronic kidney disease with stage 1 through stage 4 chronic kidney disease, or unspecified chronic kidney disease: Secondary | ICD-10-CM | POA: Diagnosis not present

## 2022-11-06 DIAGNOSIS — E1122 Type 2 diabetes mellitus with diabetic chronic kidney disease: Secondary | ICD-10-CM | POA: Diagnosis not present

## 2022-11-06 DIAGNOSIS — J441 Chronic obstructive pulmonary disease with (acute) exacerbation: Secondary | ICD-10-CM | POA: Diagnosis not present

## 2022-11-06 DIAGNOSIS — Z9981 Dependence on supplemental oxygen: Secondary | ICD-10-CM | POA: Diagnosis not present

## 2022-11-06 DIAGNOSIS — Z7984 Long term (current) use of oral hypoglycemic drugs: Secondary | ICD-10-CM | POA: Diagnosis not present

## 2022-11-06 DIAGNOSIS — E1143 Type 2 diabetes mellitus with diabetic autonomic (poly)neuropathy: Secondary | ICD-10-CM | POA: Diagnosis not present

## 2022-11-06 DIAGNOSIS — Z87891 Personal history of nicotine dependence: Secondary | ICD-10-CM | POA: Diagnosis not present

## 2022-11-06 DIAGNOSIS — N1832 Chronic kidney disease, stage 3b: Secondary | ICD-10-CM | POA: Diagnosis not present

## 2022-11-06 DIAGNOSIS — J301 Allergic rhinitis due to pollen: Secondary | ICD-10-CM | POA: Diagnosis not present

## 2022-11-11 DIAGNOSIS — N1832 Chronic kidney disease, stage 3b: Secondary | ICD-10-CM | POA: Diagnosis not present

## 2022-11-11 DIAGNOSIS — I129 Hypertensive chronic kidney disease with stage 1 through stage 4 chronic kidney disease, or unspecified chronic kidney disease: Secondary | ICD-10-CM | POA: Diagnosis not present

## 2022-11-11 DIAGNOSIS — E1122 Type 2 diabetes mellitus with diabetic chronic kidney disease: Secondary | ICD-10-CM | POA: Diagnosis not present

## 2022-11-11 DIAGNOSIS — Z9981 Dependence on supplemental oxygen: Secondary | ICD-10-CM | POA: Diagnosis not present

## 2022-11-11 DIAGNOSIS — Z7984 Long term (current) use of oral hypoglycemic drugs: Secondary | ICD-10-CM | POA: Diagnosis not present

## 2022-11-11 DIAGNOSIS — J441 Chronic obstructive pulmonary disease with (acute) exacerbation: Secondary | ICD-10-CM | POA: Diagnosis not present

## 2022-11-11 DIAGNOSIS — Z87891 Personal history of nicotine dependence: Secondary | ICD-10-CM | POA: Diagnosis not present

## 2022-11-11 DIAGNOSIS — E1143 Type 2 diabetes mellitus with diabetic autonomic (poly)neuropathy: Secondary | ICD-10-CM | POA: Diagnosis not present

## 2022-11-11 DIAGNOSIS — J301 Allergic rhinitis due to pollen: Secondary | ICD-10-CM | POA: Diagnosis not present

## 2022-11-14 DIAGNOSIS — N1832 Chronic kidney disease, stage 3b: Secondary | ICD-10-CM | POA: Diagnosis not present

## 2022-11-14 DIAGNOSIS — E1122 Type 2 diabetes mellitus with diabetic chronic kidney disease: Secondary | ICD-10-CM | POA: Diagnosis not present

## 2022-11-14 DIAGNOSIS — I129 Hypertensive chronic kidney disease with stage 1 through stage 4 chronic kidney disease, or unspecified chronic kidney disease: Secondary | ICD-10-CM | POA: Diagnosis not present

## 2022-11-14 DIAGNOSIS — J301 Allergic rhinitis due to pollen: Secondary | ICD-10-CM | POA: Diagnosis not present

## 2022-11-14 DIAGNOSIS — Z7984 Long term (current) use of oral hypoglycemic drugs: Secondary | ICD-10-CM | POA: Diagnosis not present

## 2022-11-14 DIAGNOSIS — Z9981 Dependence on supplemental oxygen: Secondary | ICD-10-CM | POA: Diagnosis not present

## 2022-11-14 DIAGNOSIS — J441 Chronic obstructive pulmonary disease with (acute) exacerbation: Secondary | ICD-10-CM | POA: Diagnosis not present

## 2022-11-14 DIAGNOSIS — E1143 Type 2 diabetes mellitus with diabetic autonomic (poly)neuropathy: Secondary | ICD-10-CM | POA: Diagnosis not present

## 2022-11-14 DIAGNOSIS — Z87891 Personal history of nicotine dependence: Secondary | ICD-10-CM | POA: Diagnosis not present

## 2022-11-18 DIAGNOSIS — E1122 Type 2 diabetes mellitus with diabetic chronic kidney disease: Secondary | ICD-10-CM | POA: Diagnosis not present

## 2022-11-18 DIAGNOSIS — E1143 Type 2 diabetes mellitus with diabetic autonomic (poly)neuropathy: Secondary | ICD-10-CM | POA: Diagnosis not present

## 2022-11-18 DIAGNOSIS — J441 Chronic obstructive pulmonary disease with (acute) exacerbation: Secondary | ICD-10-CM | POA: Diagnosis not present

## 2022-11-18 DIAGNOSIS — I129 Hypertensive chronic kidney disease with stage 1 through stage 4 chronic kidney disease, or unspecified chronic kidney disease: Secondary | ICD-10-CM | POA: Diagnosis not present

## 2022-11-18 DIAGNOSIS — J301 Allergic rhinitis due to pollen: Secondary | ICD-10-CM | POA: Diagnosis not present

## 2022-11-18 DIAGNOSIS — Z87891 Personal history of nicotine dependence: Secondary | ICD-10-CM | POA: Diagnosis not present

## 2022-11-18 DIAGNOSIS — Z9981 Dependence on supplemental oxygen: Secondary | ICD-10-CM | POA: Diagnosis not present

## 2022-11-18 DIAGNOSIS — N1832 Chronic kidney disease, stage 3b: Secondary | ICD-10-CM | POA: Diagnosis not present

## 2022-11-18 DIAGNOSIS — Z7984 Long term (current) use of oral hypoglycemic drugs: Secondary | ICD-10-CM | POA: Diagnosis not present

## 2023-01-05 DIAGNOSIS — E7849 Other hyperlipidemia: Secondary | ICD-10-CM | POA: Diagnosis not present

## 2023-01-05 DIAGNOSIS — E1143 Type 2 diabetes mellitus with diabetic autonomic (poly)neuropathy: Secondary | ICD-10-CM | POA: Diagnosis not present

## 2023-01-05 DIAGNOSIS — J440 Chronic obstructive pulmonary disease with acute lower respiratory infection: Secondary | ICD-10-CM | POA: Diagnosis not present

## 2023-01-05 DIAGNOSIS — E1121 Type 2 diabetes mellitus with diabetic nephropathy: Secondary | ICD-10-CM | POA: Diagnosis not present

## 2023-01-05 DIAGNOSIS — J449 Chronic obstructive pulmonary disease, unspecified: Secondary | ICD-10-CM | POA: Diagnosis not present

## 2023-01-05 DIAGNOSIS — Z6827 Body mass index (BMI) 27.0-27.9, adult: Secondary | ICD-10-CM | POA: Diagnosis not present

## 2023-01-05 DIAGNOSIS — Z0000 Encounter for general adult medical examination without abnormal findings: Secondary | ICD-10-CM | POA: Diagnosis not present

## 2023-01-05 DIAGNOSIS — I10 Essential (primary) hypertension: Secondary | ICD-10-CM | POA: Diagnosis not present

## 2023-01-05 DIAGNOSIS — N1832 Chronic kidney disease, stage 3b: Secondary | ICD-10-CM | POA: Diagnosis not present

## 2023-01-13 DIAGNOSIS — R531 Weakness: Secondary | ICD-10-CM | POA: Diagnosis not present

## 2023-01-13 DIAGNOSIS — R1033 Periumbilical pain: Secondary | ICD-10-CM | POA: Diagnosis not present

## 2023-01-13 DIAGNOSIS — R2241 Localized swelling, mass and lump, right lower limb: Secondary | ICD-10-CM | POA: Diagnosis not present

## 2023-01-13 DIAGNOSIS — E785 Hyperlipidemia, unspecified: Secondary | ICD-10-CM | POA: Diagnosis not present

## 2023-01-13 DIAGNOSIS — M25512 Pain in left shoulder: Secondary | ICD-10-CM | POA: Diagnosis not present

## 2023-01-13 DIAGNOSIS — M19012 Primary osteoarthritis, left shoulder: Secondary | ICD-10-CM | POA: Diagnosis not present

## 2023-01-13 DIAGNOSIS — R109 Unspecified abdominal pain: Secondary | ICD-10-CM | POA: Diagnosis not present

## 2023-01-13 DIAGNOSIS — R0602 Shortness of breath: Secondary | ICD-10-CM | POA: Diagnosis not present

## 2023-01-13 DIAGNOSIS — E1122 Type 2 diabetes mellitus with diabetic chronic kidney disease: Secondary | ICD-10-CM | POA: Diagnosis not present

## 2023-01-13 DIAGNOSIS — N281 Cyst of kidney, acquired: Secondary | ICD-10-CM | POA: Diagnosis not present

## 2023-01-13 DIAGNOSIS — R918 Other nonspecific abnormal finding of lung field: Secondary | ICD-10-CM | POA: Diagnosis not present

## 2023-01-13 DIAGNOSIS — J449 Chronic obstructive pulmonary disease, unspecified: Secondary | ICD-10-CM | POA: Diagnosis not present

## 2023-01-13 DIAGNOSIS — I517 Cardiomegaly: Secondary | ICD-10-CM | POA: Diagnosis not present

## 2023-01-13 DIAGNOSIS — I700 Atherosclerosis of aorta: Secondary | ICD-10-CM | POA: Diagnosis not present

## 2023-01-13 DIAGNOSIS — I129 Hypertensive chronic kidney disease with stage 1 through stage 4 chronic kidney disease, or unspecified chronic kidney disease: Secondary | ICD-10-CM | POA: Diagnosis not present

## 2023-01-13 DIAGNOSIS — N182 Chronic kidney disease, stage 2 (mild): Secondary | ICD-10-CM | POA: Diagnosis not present

## 2023-01-15 DIAGNOSIS — M79604 Pain in right leg: Secondary | ICD-10-CM | POA: Diagnosis not present

## 2023-01-15 DIAGNOSIS — R2241 Localized swelling, mass and lump, right lower limb: Secondary | ICD-10-CM | POA: Diagnosis not present

## 2023-02-02 DIAGNOSIS — Z6827 Body mass index (BMI) 27.0-27.9, adult: Secondary | ICD-10-CM | POA: Diagnosis not present

## 2023-02-02 DIAGNOSIS — I10 Essential (primary) hypertension: Secondary | ICD-10-CM | POA: Diagnosis not present

## 2023-02-02 DIAGNOSIS — R1084 Generalized abdominal pain: Secondary | ICD-10-CM | POA: Diagnosis not present

## 2023-02-02 DIAGNOSIS — E1121 Type 2 diabetes mellitus with diabetic nephropathy: Secondary | ICD-10-CM | POA: Diagnosis not present

## 2023-02-02 DIAGNOSIS — E1143 Type 2 diabetes mellitus with diabetic autonomic (poly)neuropathy: Secondary | ICD-10-CM | POA: Diagnosis not present

## 2023-02-11 DIAGNOSIS — R109 Unspecified abdominal pain: Secondary | ICD-10-CM | POA: Diagnosis not present

## 2023-03-31 DIAGNOSIS — E1121 Type 2 diabetes mellitus with diabetic nephropathy: Secondary | ICD-10-CM | POA: Diagnosis not present

## 2023-03-31 DIAGNOSIS — E1143 Type 2 diabetes mellitus with diabetic autonomic (poly)neuropathy: Secondary | ICD-10-CM | POA: Diagnosis not present

## 2023-03-31 DIAGNOSIS — N1831 Chronic kidney disease, stage 3a: Secondary | ICD-10-CM | POA: Diagnosis not present

## 2023-03-31 DIAGNOSIS — Z6826 Body mass index (BMI) 26.0-26.9, adult: Secondary | ICD-10-CM | POA: Diagnosis not present

## 2023-03-31 DIAGNOSIS — J449 Chronic obstructive pulmonary disease, unspecified: Secondary | ICD-10-CM | POA: Diagnosis not present

## 2023-03-31 DIAGNOSIS — E7849 Other hyperlipidemia: Secondary | ICD-10-CM | POA: Diagnosis not present

## 2023-03-31 DIAGNOSIS — I10 Essential (primary) hypertension: Secondary | ICD-10-CM | POA: Diagnosis not present

## 2023-03-31 DIAGNOSIS — J301 Allergic rhinitis due to pollen: Secondary | ICD-10-CM | POA: Diagnosis not present

## 2023-04-17 DIAGNOSIS — R0600 Dyspnea, unspecified: Secondary | ICD-10-CM | POA: Diagnosis not present

## 2023-04-17 DIAGNOSIS — K5900 Constipation, unspecified: Secondary | ICD-10-CM | POA: Diagnosis not present

## 2023-04-17 DIAGNOSIS — I517 Cardiomegaly: Secondary | ICD-10-CM | POA: Diagnosis not present

## 2023-04-17 DIAGNOSIS — I77810 Thoracic aortic ectasia: Secondary | ICD-10-CM | POA: Diagnosis not present

## 2023-04-17 DIAGNOSIS — Z90710 Acquired absence of both cervix and uterus: Secondary | ICD-10-CM | POA: Diagnosis not present

## 2023-04-17 DIAGNOSIS — E785 Hyperlipidemia, unspecified: Secondary | ICD-10-CM | POA: Diagnosis not present

## 2023-04-17 DIAGNOSIS — J449 Chronic obstructive pulmonary disease, unspecified: Secondary | ICD-10-CM | POA: Diagnosis not present

## 2023-04-17 DIAGNOSIS — I509 Heart failure, unspecified: Secondary | ICD-10-CM | POA: Diagnosis not present

## 2023-04-17 DIAGNOSIS — E1122 Type 2 diabetes mellitus with diabetic chronic kidney disease: Secondary | ICD-10-CM | POA: Diagnosis not present

## 2023-04-17 DIAGNOSIS — I130 Hypertensive heart and chronic kidney disease with heart failure and stage 1 through stage 4 chronic kidney disease, or unspecified chronic kidney disease: Secondary | ICD-10-CM | POA: Diagnosis not present

## 2023-04-17 DIAGNOSIS — N182 Chronic kidney disease, stage 2 (mild): Secondary | ICD-10-CM | POA: Diagnosis not present

## 2023-04-17 DIAGNOSIS — I771 Stricture of artery: Secondary | ICD-10-CM | POA: Diagnosis not present

## 2023-04-20 DIAGNOSIS — I10 Essential (primary) hypertension: Secondary | ICD-10-CM | POA: Diagnosis not present

## 2023-04-20 DIAGNOSIS — Z6827 Body mass index (BMI) 27.0-27.9, adult: Secondary | ICD-10-CM | POA: Diagnosis not present

## 2023-04-20 DIAGNOSIS — K5900 Constipation, unspecified: Secondary | ICD-10-CM | POA: Diagnosis not present

## 2023-05-10 ENCOUNTER — Ambulatory Visit (INDEPENDENT_AMBULATORY_CARE_PROVIDER_SITE_OTHER): Payer: Medicare HMO | Admitting: Gastroenterology

## 2023-05-10 ENCOUNTER — Encounter (INDEPENDENT_AMBULATORY_CARE_PROVIDER_SITE_OTHER): Payer: Self-pay | Admitting: Gastroenterology

## 2023-05-10 VITALS — BP 134/89 | HR 84 | Temp 97.8°F | Ht 61.5 in | Wt 139.6 lb

## 2023-05-10 DIAGNOSIS — K5900 Constipation, unspecified: Secondary | ICD-10-CM | POA: Diagnosis not present

## 2023-05-10 DIAGNOSIS — R634 Abnormal weight loss: Secondary | ICD-10-CM | POA: Insufficient documentation

## 2023-05-10 MED ORDER — PEG 3350-KCL-NA BICARB-NACL 420 G PO SOLR: 4000.0000 mL | Freq: Once | ORAL | 0 refills | Status: AC

## 2023-05-10 NOTE — Patient Instructions (Signed)
Please complete bowel prep You can stop the constulose I am providing linzess to start once daily after bowel prep Increase water intake, aim for atleast 64 oz per day Increase fruits, veggies and whole grains, kiwi and prunes are especially good for constipation Please let me know how linzess is working for you and I can send a prescription  Follow up 4-6 weeks  It was a pleasure to see you today. I want to create trusting relationships with patients and provide genuine, compassionate, and quality care. I truly value your feedback! please be on the lookout for a survey regarding your visit with me today. I appreciate your input about our visit and your time in completing this!    Rayjon Wery L. Jeanmarie Hubert, MSN, APRN, AGNP-C Adult-Gerontology Nurse Practitioner [facility]

## 2023-05-10 NOTE — Progress Notes (Addendum)
Referring Provider: Toma Deiters, MD Primary Care Physician:  Toma Deiters, MD Primary GI Physician: Dr. Levon Hedger   Chief Complaint  Patient presents with   Constipation    Patient here today with issues with constipation and she was given Constulose solution 30 ml prn and this is not helping. She has tried linzess samples in the past, she does not know the mg. Has tried miralax,but did not like it.    HPI:   Taylor Shields is a 72 y.o. female with past medical history of asthma, COPD, gout, hypertension and CHF   Patient presenting today for constipation  Present: Recent TSH 2.383, Sodium, potassium normal, calcium 10.2 earlier this month   Reports she has had constipation for years though seems to be worse recently.  She went to the ER a few weeks back for constipation and feeling fatigued. She was given constulose which she is taking, noting she may have a very small BM once per week but no good BMs since last week. She denies abdominal pain, or vomiting but has some nausea at times. Appetite is good per her family. She has had some weight loss, down from around 170 lbs 2 years ago, now to 139 lbs. She also notes some hair loss as well. She feels water intake is good. Noting stools are very hard when she is able to go. No weight loss or rectal bleeding. She has tried prune juice, mag citrate, miralax.   She has visit with new PCP Tresa Res tomorrow.   [place] Abd Complete: 02/2023 unremarkable  CT A/P with contrast 01/2023: Motion degradation limits the exam.  Negative for hydronephrosis or bowel obstruction. Slightly thick-walled bladder, correlate with urinalysis to exclude cystitis. 14 mm indeterminate hypodense lesion posterior cortex left  kidney. Suggest further evaluation with nonemergent renal ultrasound  Aortic atherosclerosis.  Last Colonoscopy:01/2021  - Rectal prolapse found on perianal exam.                           - Two 4 to 12 mm polyps in the ascending  colon,                            removed with a cold snare. Resected and retrieved.                           - One 2 mm polyp in the ascending colon, removed                            with a cold biopsy forceps. Resected and retrieved.                           - Scar in the descending colon.                           - Diverticulosis in the sigmoid colon, in the                            descending colon, in the transverse colon and in  the ascending colon.                           - The distal rectum and anal verge are normal on                            retroflexion view. 3 tubular adenomas, repeat colonoscopy in 5 years (needs two day prep)    Past Medical History:  Diagnosis Date   Asthma    CHF (congestive heart failure) (HCC)    COPD (chronic obstructive pulmonary disease) (HCC)    Gout    Hypertension     Past Surgical History:  Procedure Laterality Date   ABDOMINAL HYSTERECTOMY     CARPAL TUNNEL RELEASE Bilateral    COLON SURGERY     COLONOSCOPY WITH PROPOFOL N/A 01/29/2021   Procedure: COLONOSCOPY WITH PROPOFOL;  Surgeon: Dolores Frame, MD;  Location: [facility];  Service: Gastroenterology;  Laterality: N/A;  7:30 am   LIPOMA EXCISION     PARTIAL HYSTERECTOMY     POLYPECTOMY  01/29/2021   Procedure: POLYPECTOMY INTESTINAL;  Surgeon: Marguerita Merles, Reuel Boom, MD;  Location: [facility];  Service: Gastroenterology;;    Current Outpatient Medications  Medication Sig Dispense Refill   albuterol (PROAIR HFA) 108 (90 BASE) MCG/ACT inhaler Inhale 1 puff into the lungs every 6 (six) hours as needed for wheezing or shortness of breath.     alendronate (FOSAMAX) 70 MG tablet Take 70 mg by mouth once a week. Take with a full glass of water on an empty stomach.     allopurinol (ZYLOPRIM) 300 MG tablet Take 300 mg by mouth daily.     calcium-vitamin D (OSCAL WITH D) 500-200 MG-UNIT tablet Take 1 tablet by mouth daily with breakfast.      Cholecalciferol (VITAMIN D3) 50 MCG (2000 UT) TABS Take by mouth daily at 6 (six) AM.     diltiazem (TIADYLT ER) 360 MG 24 hr capsule Take 360 mg by mouth daily.     DiphenhydrAMINE HCl (ALLERGY MEDICATION PO) Take 1 tablet by mouth daily.     furosemide (LASIX) 40 MG tablet Take 40 mg by mouth.     guaiFENesin (MUCINEX) 600 MG 12 hr tablet Take 1,200 mg by mouth 2 (two) times daily.     labetalol (NORMODYNE) 300 MG tablet Take 300 mg by mouth 2 (two) times daily.     metFORMIN (GLUCOPHAGE-XR) 500 MG 24 hr tablet Take 500 mg by mouth daily with breakfast.     cloNIDine (CATAPRES) 0.2 MG tablet Take 0.2 mg by mouth 2 (two) times daily.     esomeprazole (NEXIUM) 40 MG capsule Take 40 mg by mouth daily at 12 noon.     simvastatin (ZOCOR) 20 MG tablet Take 20 mg by mouth daily.     No current facility-administered medications for this visit.    Allergies as of 05/10/2023 - Review Complete 05/10/2023  Allergen Reaction Noted   Penicillins Itching 11/25/2013    Family History  Problem Relation Age of Onset   Osteoarthritis Mother    Hypertension Mother     Social History   Socioeconomic History   Marital status: Widowed    Spouse name: Not on file   Number of children: Not on file   Years of education: Not on file   Highest education level: Not on file  Occupational History   Not on file  Tobacco Use   Smoking status: Never   Smokeless tobacco: Never  Vaping Use   Vaping status: Never Used  Substance and Sexual Activity   Alcohol use: No   Drug use: No   Sexual activity: Yes    Birth control/protection: Surgical  Other Topics Concern   Not on file  Social History Narrative   Not on file   Social Determinants of Health   Financial Resource Strain: Not on file  Food Insecurity: Not on file  Transportation Needs: Not on file  Physical Activity: Not on file  Stress: Not on file  Social Connections: Not on file    Review of systems General: negative for malaise,  night sweats, fever, chills, weight loss Neck: Negative for lumps, goiter, pain and significant neck swelling Resp: Negative for cough, wheezing, dyspnea at rest CV: Negative for chest pain, leg swelling, palpitations, orthopnea GI: denies melena, hematochezia, vomiting, diarrhea,  dysphagia, odyonophagia, early satiety or unintentional weight loss. +nausea +constipation  MSK: Negative for joint pain or swelling, back pain, and muscle pain. Derm: Negative for itching or rash Psych: Denies depression, anxiety, memory loss, confusion. No homicidal or suicidal ideation.  Heme: Negative for prolonged bleeding, bruising easily, and swollen nodes. Endocrine: Negative for cold or heat intolerance, polyuria, polydipsia and goiter. Neuro: negative for tremor, gait imbalance, syncope and seizures. The remainder of the review of systems is noncontributory.  Physical Exam: BP 134/89 (BP Location: Left Arm, Patient Position: Sitting, Cuff Size: Normal)   Pulse 84   Temp 97.8 F (36.6 C) (Temporal)   Ht 5' 1.5" (1.562 m)   Wt 139 lb 9.6 oz (63.3 kg)   BMI 25.95 kg/m  General:   Alert and oriented. No distress noted. Pleasant and cooperative.  Head:  Normocephalic and atraumatic. Eyes:  Conjuctiva clear without scleral icterus. Mouth:  Oral mucosa pink and moist. Good dentition. No lesions. Heart: Normal rate and rhythm, s1 and s2 heart sounds present.  Lungs: Clear lung sounds in all lobes. Respirations equal and unlabored. Abdomen:  +BS, soft, non-tender and non-distended. No rebound or guarding. No HSM or masses noted. Derm: No palmar erythema or jaundice Msk:  Symmetrical without gross deformities. Normal posture. Extremities:  Without edema. Neurologic:  Alert and  oriented x4 Psych:  Alert and cooperative. Normal mood and affect.  Invalid input(s): "6 MONTHS"   ASSESSMENT: Taylor Shields is a 72 y.o. female presenting today for constipation  Patient family reporting chronic  history of constipation though somewhat worse recently.  Recent labs done earlier this month with normal TSH, sodium, potassium and only very mildly elevated calcium at 10.2.  She is notably maintained on Os-Cal which could be contributing to her constipation.  She notes she has not had a good BM since last week.  Denies abdominal pain, rectal bleeding, melena.  Appetite seems unchanged and presence of constipation.  She has no vomiting.  Recommend completing bowel prep and starting Linzess to 90 mcg daily thereafter.   She has notably lost about 31 pounds over the past 2 years though reports appetite is good, as above, TSH is WNL though she does also report some hair loss and fatigue recently. Has upcoming appt tomorrow with new PCP and should discuss these symptoms with them as well. Recent colonoscopy in 2022 and CT A/P in July without findings to explain weight loss. If weight loss persists, may need to consider EGD for further evaluation to rule out causes in UGI tract that could be contributing.  PLAN:  Bowel prep  2. Start linzess daily thereafter (samples provided)  3. Increase water intake, aim for atleast 64 oz per day Increase fruits, veggies and whole grains, kiwi and prunes are especially good for constipation  All questions were answered, patient verbalized understanding and is in agreement with plan as outlined above.    Follow Up: 4-6 weeks  Furman Trentman L. Jeanmarie Hubert, MSN, APRN, AGNP-C Adult-Gerontology Nurse Practitioner [facility]  I have reviewed the note and agree with the APP's assessment as described in this progress note  Katrinka Blazing, MD Gastroenterology and Hepatology [facility]

## 2023-05-12 ENCOUNTER — Other Ambulatory Visit (INDEPENDENT_AMBULATORY_CARE_PROVIDER_SITE_OTHER): Payer: Self-pay | Admitting: Gastroenterology

## 2023-05-12 ENCOUNTER — Telehealth (INDEPENDENT_AMBULATORY_CARE_PROVIDER_SITE_OTHER): Payer: Self-pay | Admitting: *Deleted

## 2023-05-12 DIAGNOSIS — I10 Essential (primary) hypertension: Secondary | ICD-10-CM | POA: Diagnosis not present

## 2023-05-12 DIAGNOSIS — E785 Hyperlipidemia, unspecified: Secondary | ICD-10-CM | POA: Diagnosis not present

## 2023-05-12 DIAGNOSIS — K5900 Constipation, unspecified: Secondary | ICD-10-CM

## 2023-05-12 DIAGNOSIS — Z299 Encounter for prophylactic measures, unspecified: Secondary | ICD-10-CM | POA: Diagnosis not present

## 2023-05-12 DIAGNOSIS — F0390 Unspecified dementia without behavioral disturbance: Secondary | ICD-10-CM | POA: Diagnosis not present

## 2023-05-12 DIAGNOSIS — R2689 Other abnormalities of gait and mobility: Secondary | ICD-10-CM | POA: Diagnosis not present

## 2023-05-12 DIAGNOSIS — E1169 Type 2 diabetes mellitus with other specified complication: Secondary | ICD-10-CM | POA: Diagnosis not present

## 2023-05-12 NOTE — Telephone Encounter (Signed)
Tried calling the patient vm not set up and home phone is disconnected.

## 2023-05-12 NOTE — Telephone Encounter (Signed)
Pt seen on 10/28 and daughter Patina called back and said she did bowel prep and only had one BM. She doubled up today on linzess 290 and still has not had BM today. No abdominal pain.   [phone]

## 2023-05-13 NOTE — Telephone Encounter (Signed)
Tried to call number in chart and number left on message and no answer.

## 2023-05-14 NOTE — Telephone Encounter (Signed)
Discussed Taylor Shields ordered an xray of her abdomen, patient can go over to [facility] and walk in anytime during normal business hours to have this done. Patient aware

## 2023-05-21 ENCOUNTER — Ambulatory Visit (HOSPITAL_COMMUNITY)
Admission: EM | Admit: 2023-05-21 | Discharge: 2023-05-21 | Discharge status: Against Medical Advice | Payer: Medicare HMO | Attending: Gastroenterology | Admitting: Gastroenterology

## 2023-05-21 ENCOUNTER — Emergency Department (HOSPITAL_COMMUNITY)
Admission: EM | Admit: 2023-05-21 | Discharge: 2023-05-21 | Discharge status: Against Medical Advice | Payer: Medicare HMO

## 2023-05-21 DIAGNOSIS — R109 Unspecified abdominal pain: Secondary | ICD-10-CM | POA: Diagnosis not present

## 2023-05-21 DIAGNOSIS — K5900 Constipation, unspecified: Secondary | ICD-10-CM | POA: Insufficient documentation

## 2023-05-21 NOTE — ED Notes (Signed)
Pt stated she checked into the wrong place and does not want to be seen in the ED

## 2023-05-24 ENCOUNTER — Other Ambulatory Visit (INDEPENDENT_AMBULATORY_CARE_PROVIDER_SITE_OTHER): Payer: Self-pay | Admitting: Gastroenterology

## 2023-05-24 DIAGNOSIS — R6889 Other general symptoms and signs: Secondary | ICD-10-CM | POA: Diagnosis not present

## 2023-05-24 MED ORDER — NA SULFATE-K SULFATE-MG SULF 17.5-3.13-1.6 GM/177ML PO SOLN
1.0000 | Freq: Once | ORAL | 0 refills | Status: AC
Start: 2023-05-24 — End: 2023-05-24

## 2023-06-15 DIAGNOSIS — Z7189 Other specified counseling: Secondary | ICD-10-CM | POA: Diagnosis not present

## 2023-06-15 DIAGNOSIS — I10 Essential (primary) hypertension: Secondary | ICD-10-CM | POA: Diagnosis not present

## 2023-06-15 DIAGNOSIS — Z1339 Encounter for screening examination for other mental health and behavioral disorders: Secondary | ICD-10-CM | POA: Diagnosis not present

## 2023-06-15 DIAGNOSIS — Z299 Encounter for prophylactic measures, unspecified: Secondary | ICD-10-CM | POA: Diagnosis not present

## 2023-06-15 DIAGNOSIS — Z79899 Other long term (current) drug therapy: Secondary | ICD-10-CM | POA: Diagnosis not present

## 2023-06-15 DIAGNOSIS — E7800 Pure hypercholesterolemia, unspecified: Secondary | ICD-10-CM | POA: Diagnosis not present

## 2023-06-15 DIAGNOSIS — E039 Hypothyroidism, unspecified: Secondary | ICD-10-CM | POA: Diagnosis not present

## 2023-06-15 DIAGNOSIS — Z1331 Encounter for screening for depression: Secondary | ICD-10-CM | POA: Diagnosis not present

## 2023-06-15 DIAGNOSIS — J449 Chronic obstructive pulmonary disease, unspecified: Secondary | ICD-10-CM | POA: Diagnosis not present

## 2023-06-15 DIAGNOSIS — E1169 Type 2 diabetes mellitus with other specified complication: Secondary | ICD-10-CM | POA: Diagnosis not present

## 2023-06-15 DIAGNOSIS — Z0000 Encounter for general adult medical examination without abnormal findings: Secondary | ICD-10-CM | POA: Diagnosis not present

## 2023-06-21 ENCOUNTER — Encounter (INDEPENDENT_AMBULATORY_CARE_PROVIDER_SITE_OTHER): Payer: Self-pay | Admitting: Gastroenterology

## 2023-06-21 ENCOUNTER — Ambulatory Visit (INDEPENDENT_AMBULATORY_CARE_PROVIDER_SITE_OTHER): Payer: Medicare HMO | Admitting: Gastroenterology

## 2023-06-21 VITALS — BP 137/88 | HR 67 | Temp 97.8°F | Ht 61.5 in | Wt 141.6 lb

## 2023-06-21 DIAGNOSIS — K5900 Constipation, unspecified: Secondary | ICD-10-CM

## 2023-06-21 DIAGNOSIS — R09A2 Foreign body sensation, throat: Secondary | ICD-10-CM | POA: Diagnosis not present

## 2023-06-21 MED ORDER — LACTULOSE 10 GM/15ML PO SOLN: 20.0000 g | Freq: Two times a day (BID) | ORAL | 2 refills | Status: DC

## 2023-06-21 MED ORDER — PANTOPRAZOLE SODIUM 40 MG PO TBEC: 40.0000 mg | DELAYED_RELEASE_TABLET | Freq: Every day | ORAL | 1 refills | Status: DC

## 2023-06-21 NOTE — Progress Notes (Signed)
Referring Provider: Toma Deiters, MD Primary Care Physician:  Golden Pop, FNP Primary GI Physician: Dr. Levon Hedger   Chief Complaint  Patient presents with   Follow-up    Patient here today still having issues with constipation. Says pcp placed her on Miralax bid and this is still of no help.   HPI:   Taylor Shields is a 72 y.o. female with past medical history of athma, COPD, Gout, HTN, CHF   Patient presenting today for follow up of constipation   Last visit in October with reports of chronic constipation though worse recently.  Using counselors that was given to her in the ER they are having very small BM per week.  Has had some weight loss down from around 170 pounds 2 years ago now to 139 pounds.  Also notes appear loss as well.  Given bowel prep advised start Linzess 290 thereafter, increase water intake, fruits, veggies, whole grains.  Patient's family called back on 10/30 stating patient did bowel prep and only had 1 BM, she had doubled up on Linzess and still not had any BMs.  She was advised to have KUB which was on 11/8 and showed large colonic stool burden, no evidence of bowel obstruction.  Recent TSH 2.383, sodium, potassium normal, calcium 10.2.  Present: States that prune juice and lactulose seem to help her have a BM. She is doing this PRN. She notes when she is not taking these she may have really hard stool balls. She is only having a BM when she does the prune juice or lactulose, she is about out of lactulose. She notes she did not really feel that bowel prep cleaned her out very well. She notes that stools after bowel prep were more mushy/thicker. She does not feel that linzess did much at all to help her move her bowels. She took miralax BID for about 1 week and does not feel this helped either. She notes that she often strains as she feels she has to defecate but cannot pass stool. Appetite is good. No rectal bleeding or melena. Denies rectal  pain. She is trying to drink plenty of water.   Patient also notes some globus sensation and needing to cough up some clear phlegm. Does have a cough at times. No sore throat. Denies heartburn or acid regurgitation. No dysphagia. On nexium currently.   [place] Abd Complete: 02/2023 unremarkable  CT A/P with contrast 01/2023: Motion degradation limits the exam.  Negative for hydronephrosis or bowel obstruction. Slightly thick-walled bladder, correlate with urinalysis to exclude cystitis. 14 mm indeterminate hypodense lesion posterior cortex left  kidney. Suggest further evaluation with nonemergent renal ultrasound  Aortic atherosclerosis.  Last Colonoscopy:01/2021  - Rectal prolapse found on perianal exam.                           - Two 4 to 12 mm polyps in the ascending colon,                            removed with a cold snare. Resected and retrieved.                           - One 2 mm polyp in the ascending colon, removed  with a cold biopsy forceps. Resected and retrieved.                           - Scar in the descending colon.                           - Diverticulosis in the sigmoid colon, in the                            descending colon, in the transverse colon and in                            the ascending colon.                           - The distal rectum and anal verge are normal on                            retroflexion view. 3 tubular adenomas, repeat colonoscopy in 5 years (needs two day prep)   Past Medical History:  Diagnosis Date   Asthma    CHF (congestive heart failure) (HCC)    COPD (chronic obstructive pulmonary disease) (HCC)    Gout    Hypertension     Past Surgical History:  Procedure Laterality Date   ABDOMINAL HYSTERECTOMY     CARPAL TUNNEL RELEASE Bilateral    COLON SURGERY     COLONOSCOPY WITH PROPOFOL N/A 01/29/2021   Procedure: COLONOSCOPY WITH PROPOFOL;  Surgeon: Dolores Frame, MD;  Location: [facility];   Service: Gastroenterology;  Laterality: N/A;  7:30 am   LIPOMA EXCISION     PARTIAL HYSTERECTOMY     POLYPECTOMY  01/29/2021   Procedure: POLYPECTOMY INTESTINAL;  Surgeon: Marguerita Merles, Reuel Boom, MD;  Location: [facility];  Service: Gastroenterology;;    Current Outpatient Medications  Medication Sig Dispense Refill   albuterol (PROAIR HFA) 108 (90 BASE) MCG/ACT inhaler Inhale 1 puff into the lungs every 6 (six) hours as needed for wheezing or shortness of breath.     alendronate (FOSAMAX) 70 MG tablet Take 70 mg by mouth once a week. Take with a full glass of water on an empty stomach.     allopurinol (ZYLOPRIM) 300 MG tablet Take 300 mg by mouth daily.     calcium-vitamin D (OSCAL WITH D) 500-200 MG-UNIT tablet Take 1 tablet by mouth daily with breakfast.     Cholecalciferol (VITAMIN D3) 50 MCG (2000 UT) TABS Take by mouth daily at 6 (six) AM.     cloNIDine (CATAPRES) 0.2 MG tablet Take 0.2 mg by mouth 2 (two) times daily.     diltiazem (TIADYLT ER) 360 MG 24 hr capsule Take 360 mg by mouth daily.     DiphenhydrAMINE HCl (ALLERGY MEDICATION PO) Take 1 tablet by mouth daily.     esomeprazole (NEXIUM) 40 MG capsule Take 40 mg by mouth daily at 12 noon.     furosemide (LASIX) 40 MG tablet Take 40 mg by mouth.     guaiFENesin (MUCINEX) 600 MG 12 hr tablet Take 1,200 mg by mouth 2 (two) times daily.     labetalol (NORMODYNE) 300 MG tablet Take 300 mg by mouth 2 (two) times daily.     metFORMIN (  GLUCOPHAGE-XR) 500 MG 24 hr tablet Take 500 mg by mouth daily with breakfast.     polyethylene glycol (MIRALAX / GLYCOLAX) 17 g packet Take 17 g by mouth 2 (two) times daily.     simvastatin (ZOCOR) 20 MG tablet Take 20 mg by mouth daily.     No current facility-administered medications for this visit.    Allergies as of 06/21/2023 - Review Complete 06/21/2023  Allergen Reaction Noted   Penicillins Itching 11/25/2013    Family History  Problem Relation Age of Onset   Osteoarthritis Mother     Hypertension Mother     Social History   Socioeconomic History   Marital status: Widowed    Spouse name: Not on file   Number of children: Not on file   Years of education: Not on file   Highest education level: Not on file  Occupational History   Not on file  Tobacco Use   Smoking status: Never   Smokeless tobacco: Never  Vaping Use   Vaping status: Never Used  Substance and Sexual Activity   Alcohol use: No   Drug use: No   Sexual activity: Yes    Birth control/protection: Surgical  Other Topics Concern   Not on file  Social History Narrative   Not on file   Social Determinants of Health   Financial Resource Strain: Not on file  Food Insecurity: Not on file  Transportation Needs: Not on file  Physical Activity: Not on file  Stress: Not on file  Social Connections: Not on file    Review of systems General: negative for malaise, night sweats, fever, chills, weight loss Neck: Negative for lumps, goiter, pain and significant neck swelling Resp: Negative for cough, wheezing, dyspnea at rest CV: Negative for chest pain, leg swelling, palpitations, orthopnea GI: denies melena, hematochezia, nausea, vomiting, diarrhea, dysphagia, odyonophagia, early satiety or unintentional weight loss. +constipation +globus sensation  MSK: Negative for joint pain or swelling, back pain, and muscle pain. Derm: Negative for itching or rash Psych: Denies depression, anxiety, memory loss, confusion. No homicidal or suicidal ideation.  Heme: Negative for prolonged bleeding, bruising easily, and swollen nodes. Endocrine: Negative for cold or heat intolerance, polyuria, polydipsia and goiter. Neuro: negative for tremor, gait imbalance, syncope and seizures. The remainder of the review of systems is noncontributory.  Physical Exam: BP 137/88 (BP Location: Right Arm, Patient Position: Sitting, Cuff Size: Normal)   Pulse 67   Temp 97.8 F (36.6 C) (Temporal)   Ht 5' 1.5" (1.562 m)   Wt  141 lb 9.6 oz (64.2 kg)   BMI 26.32 kg/m  General:   Alert and oriented. No distress noted. Pleasant and cooperative.  Head:  Normocephalic and atraumatic. Eyes:  Conjuctiva clear without scleral icterus. Mouth:  Oral mucosa pink and moist. Good dentition. No lesions. Heart: Normal rate and rhythm, s1 and s2 heart sounds present.  Lungs: Clear lung sounds in all lobes. Respirations equal and unlabored. Abdomen:  +BS, soft, non-tender and non-distended. No rebound or guarding. No HSM or masses noted. Derm: No palmar erythema or jaundice Msk:  Symmetrical without gross deformities. Normal posture. Extremities:  Without edema. Neurologic:  Alert and  oriented x4 Psych:  Alert and cooperative. Normal mood and affect.  Invalid input(s): "6 MONTHS"   ASSESSMENT: Taylor Shields is a 72 y.o. female presenting today for follow up of diarrhea  Patient continues to have issues with constipation.  KUB done in October 2 has large colon stool burdenDid not  note much improvement with bowel prep, Linzess nor MiraLAX have provided results for her either.  She notes that she will have hard stool balls most of the time, does feel that she moves her bowels well if she does prune juice or lactulose as needed. She notes that she will have hard stool balls most of the time, does feel that she moves her bowels well if she does prune juice or lactulose as needed. She denies abdominal pain, nausea, vomiting, rectal pain, melena.  She has had no weight loss.  Fairly recent colonoscopy in 2022 with rectal prolapse which could be contributing to her constipation issues.  For now we will resume lactulose at 20 g twice daily, I did updating colonoscopy/possible flex sig with the patient and her grandson if constipation is not improving with lactulose regularly.  They will discuss further with the patient's daughter.  Patient also noted globus sensation, no heartburn, acid regurgitation, dysphagia or odynophagia. Has an  cough at times. Query if this is silent reflux, she is on nexium but appears to have been on this for years. Will try switching PPI therapy and transition to protonix 40mg  daily, stopping nexium. May need to consider EGD if symptoms not improving with protonix.    PLAN:  Resume lactulose 20g BID 2. Good water intake  3. Diet high in fruits, veggies, whole grains  4.  Consider colonoscopy/flex sig  5. Stop nexium, start protonix 40mg  daily   All questions were answered, patient verbalized understanding and is in agreement with plan as outlined above.    Follow Up: 3 months   Daiya Tamer L. Jeanmarie Hubert, MSN, APRN, AGNP-C Adult-Gerontology Nurse Practitioner [facility]

## 2023-06-21 NOTE — Patient Instructions (Addendum)
Increase water intake, aim for atleast 64 oz per day Increase fruits, veggies and whole grains, kiwi and prunes are especially good for constipation I will send lactulose 30ml twice daily for you to take for constipation, if this is not improving we will need to consider colonoscopy as discussed  I have also send protonix 40mg  to be taken 30 minutes prior to breakfast in the morning to help with the thick feeling in your throat as this may be related to silent reflux, please stop nexium as these medications both treat reflux.   Follow up 3 months  It was a pleasure to see you today. I want to create trusting relationships with patients and provide genuine, compassionate, and quality care. I truly value your feedback! please be on the lookout for a survey regarding your visit with me today. I appreciate your input about our visit and your time in completing this!    Meir Elwood L. Jeanmarie Hubert, MSN, APRN, AGNP-C Adult-Gerontology Nurse Practitioner [facility] at [facility]

## 2023-06-23 DIAGNOSIS — J069 Acute upper respiratory infection, unspecified: Secondary | ICD-10-CM | POA: Diagnosis not present

## 2023-06-23 DIAGNOSIS — Z299 Encounter for prophylactic measures, unspecified: Secondary | ICD-10-CM | POA: Diagnosis not present

## 2023-06-23 DIAGNOSIS — J45909 Unspecified asthma, uncomplicated: Secondary | ICD-10-CM | POA: Diagnosis not present

## 2023-06-23 DIAGNOSIS — R093 Abnormal sputum: Secondary | ICD-10-CM | POA: Diagnosis not present

## 2023-06-30 DIAGNOSIS — Z1211 Encounter for screening for malignant neoplasm of colon: Secondary | ICD-10-CM | POA: Diagnosis not present

## 2023-07-01 ENCOUNTER — Other Ambulatory Visit: Payer: Self-pay | Admitting: Internal Medicine

## 2023-07-01 DIAGNOSIS — Z1231 Encounter for screening mammogram for malignant neoplasm of breast: Secondary | ICD-10-CM

## 2023-09-16 DIAGNOSIS — E1169 Type 2 diabetes mellitus with other specified complication: Secondary | ICD-10-CM | POA: Diagnosis not present

## 2023-09-16 DIAGNOSIS — H6123 Impacted cerumen, bilateral: Secondary | ICD-10-CM | POA: Diagnosis not present

## 2023-09-16 DIAGNOSIS — I10 Essential (primary) hypertension: Secondary | ICD-10-CM | POA: Diagnosis not present

## 2023-09-16 DIAGNOSIS — Z299 Encounter for prophylactic measures, unspecified: Secondary | ICD-10-CM | POA: Diagnosis not present

## 2023-09-16 DIAGNOSIS — J309 Allergic rhinitis, unspecified: Secondary | ICD-10-CM | POA: Diagnosis not present

## 2023-09-19 ENCOUNTER — Other Ambulatory Visit (INDEPENDENT_AMBULATORY_CARE_PROVIDER_SITE_OTHER): Payer: Self-pay | Admitting: Gastroenterology

## 2023-09-19 DIAGNOSIS — E1122 Type 2 diabetes mellitus with diabetic chronic kidney disease: Secondary | ICD-10-CM | POA: Diagnosis not present

## 2023-09-19 DIAGNOSIS — E119 Type 2 diabetes mellitus without complications: Secondary | ICD-10-CM | POA: Diagnosis not present

## 2023-09-19 DIAGNOSIS — J9601 Acute respiratory failure with hypoxia: Secondary | ICD-10-CM | POA: Diagnosis not present

## 2023-09-19 DIAGNOSIS — J101 Influenza due to other identified influenza virus with other respiratory manifestations: Secondary | ICD-10-CM | POA: Diagnosis not present

## 2023-09-19 DIAGNOSIS — E1151 Type 2 diabetes mellitus with diabetic peripheral angiopathy without gangrene: Secondary | ICD-10-CM | POA: Diagnosis not present

## 2023-09-19 DIAGNOSIS — I129 Hypertensive chronic kidney disease with stage 1 through stage 4 chronic kidney disease, or unspecified chronic kidney disease: Secondary | ICD-10-CM | POA: Diagnosis not present

## 2023-09-19 DIAGNOSIS — Z79899 Other long term (current) drug therapy: Secondary | ICD-10-CM | POA: Diagnosis not present

## 2023-09-19 DIAGNOSIS — Z7951 Long term (current) use of inhaled steroids: Secondary | ICD-10-CM | POA: Diagnosis not present

## 2023-09-19 DIAGNOSIS — I10 Essential (primary) hypertension: Secondary | ICD-10-CM | POA: Diagnosis not present

## 2023-09-19 DIAGNOSIS — I517 Cardiomegaly: Secondary | ICD-10-CM | POA: Diagnosis not present

## 2023-09-19 DIAGNOSIS — J441 Chronic obstructive pulmonary disease with (acute) exacerbation: Secondary | ICD-10-CM | POA: Diagnosis not present

## 2023-09-19 DIAGNOSIS — N182 Chronic kidney disease, stage 2 (mild): Secondary | ICD-10-CM | POA: Diagnosis not present

## 2023-09-19 DIAGNOSIS — I4891 Unspecified atrial fibrillation: Secondary | ICD-10-CM | POA: Diagnosis not present

## 2023-09-19 DIAGNOSIS — Z7952 Long term (current) use of systemic steroids: Secondary | ICD-10-CM | POA: Diagnosis not present

## 2023-09-19 DIAGNOSIS — I272 Pulmonary hypertension, unspecified: Secondary | ICD-10-CM | POA: Diagnosis not present

## 2023-09-19 DIAGNOSIS — J449 Chronic obstructive pulmonary disease, unspecified: Secondary | ICD-10-CM | POA: Diagnosis not present

## 2023-09-19 DIAGNOSIS — E785 Hyperlipidemia, unspecified: Secondary | ICD-10-CM | POA: Diagnosis not present

## 2023-09-19 DIAGNOSIS — M160 Bilateral primary osteoarthritis of hip: Secondary | ICD-10-CM | POA: Diagnosis not present

## 2023-09-19 DIAGNOSIS — R058 Other specified cough: Secondary | ICD-10-CM | POA: Diagnosis not present

## 2023-09-19 DIAGNOSIS — I771 Stricture of artery: Secondary | ICD-10-CM | POA: Diagnosis not present

## 2023-09-19 DIAGNOSIS — Z792 Long term (current) use of antibiotics: Secondary | ICD-10-CM | POA: Diagnosis not present

## 2023-09-19 DIAGNOSIS — R0602 Shortness of breath: Secondary | ICD-10-CM | POA: Diagnosis not present

## 2023-09-19 DIAGNOSIS — J811 Chronic pulmonary edema: Secondary | ICD-10-CM | POA: Diagnosis not present

## 2023-09-21 ENCOUNTER — Ambulatory Visit (INDEPENDENT_AMBULATORY_CARE_PROVIDER_SITE_OTHER): Payer: Medicare HMO | Admitting: Gastroenterology

## 2023-09-24 DIAGNOSIS — R0602 Shortness of breath: Secondary | ICD-10-CM | POA: Diagnosis not present

## 2023-09-24 DIAGNOSIS — I771 Stricture of artery: Secondary | ICD-10-CM | POA: Diagnosis not present

## 2023-09-27 DIAGNOSIS — E119 Type 2 diabetes mellitus without complications: Secondary | ICD-10-CM | POA: Diagnosis not present

## 2023-09-27 DIAGNOSIS — R531 Weakness: Secondary | ICD-10-CM | POA: Diagnosis not present

## 2023-09-27 DIAGNOSIS — E860 Dehydration: Secondary | ICD-10-CM | POA: Diagnosis not present

## 2023-09-27 DIAGNOSIS — I129 Hypertensive chronic kidney disease with stage 1 through stage 4 chronic kidney disease, or unspecified chronic kidney disease: Secondary | ICD-10-CM | POA: Diagnosis not present

## 2023-09-27 DIAGNOSIS — J4489 Other specified chronic obstructive pulmonary disease: Secondary | ICD-10-CM | POA: Diagnosis not present

## 2023-09-27 DIAGNOSIS — Z792 Long term (current) use of antibiotics: Secondary | ICD-10-CM | POA: Diagnosis not present

## 2023-09-27 DIAGNOSIS — E1122 Type 2 diabetes mellitus with diabetic chronic kidney disease: Secondary | ICD-10-CM | POA: Diagnosis not present

## 2023-09-27 DIAGNOSIS — J189 Pneumonia, unspecified organism: Secondary | ICD-10-CM | POA: Diagnosis not present

## 2023-09-27 DIAGNOSIS — J449 Chronic obstructive pulmonary disease, unspecified: Secondary | ICD-10-CM | POA: Diagnosis not present

## 2023-09-27 DIAGNOSIS — J1289 Other viral pneumonia: Secondary | ICD-10-CM | POA: Diagnosis not present

## 2023-09-27 DIAGNOSIS — J158 Pneumonia due to other specified bacteria: Secondary | ICD-10-CM | POA: Diagnosis not present

## 2023-09-27 DIAGNOSIS — I480 Paroxysmal atrial fibrillation: Secondary | ICD-10-CM | POA: Diagnosis not present

## 2023-09-27 DIAGNOSIS — E785 Hyperlipidemia, unspecified: Secondary | ICD-10-CM | POA: Diagnosis not present

## 2023-09-27 DIAGNOSIS — Z79899 Other long term (current) drug therapy: Secondary | ICD-10-CM | POA: Diagnosis not present

## 2023-09-27 DIAGNOSIS — J159 Unspecified bacterial pneumonia: Secondary | ICD-10-CM | POA: Diagnosis not present

## 2023-09-27 DIAGNOSIS — R54 Age-related physical debility: Secondary | ICD-10-CM | POA: Diagnosis not present

## 2023-09-27 DIAGNOSIS — N179 Acute kidney failure, unspecified: Secondary | ICD-10-CM | POA: Diagnosis not present

## 2023-09-27 DIAGNOSIS — R079 Chest pain, unspecified: Secondary | ICD-10-CM | POA: Diagnosis not present

## 2023-09-27 DIAGNOSIS — N170 Acute kidney failure with tubular necrosis: Secondary | ICD-10-CM | POA: Diagnosis not present

## 2023-09-27 DIAGNOSIS — I10 Essential (primary) hypertension: Secondary | ICD-10-CM | POA: Diagnosis not present

## 2023-09-27 DIAGNOSIS — R918 Other nonspecific abnormal finding of lung field: Secondary | ICD-10-CM | POA: Diagnosis not present

## 2023-09-27 DIAGNOSIS — N189 Chronic kidney disease, unspecified: Secondary | ICD-10-CM | POA: Diagnosis not present

## 2023-09-27 DIAGNOSIS — Z8709 Personal history of other diseases of the respiratory system: Secondary | ICD-10-CM | POA: Diagnosis not present

## 2023-09-27 DIAGNOSIS — R638 Other symptoms and signs concerning food and fluid intake: Secondary | ICD-10-CM | POA: Diagnosis not present

## 2023-09-30 DIAGNOSIS — N189 Chronic kidney disease, unspecified: Secondary | ICD-10-CM | POA: Diagnosis not present

## 2023-09-30 DIAGNOSIS — E1122 Type 2 diabetes mellitus with diabetic chronic kidney disease: Secondary | ICD-10-CM | POA: Diagnosis not present

## 2023-09-30 DIAGNOSIS — E860 Dehydration: Secondary | ICD-10-CM | POA: Diagnosis not present

## 2023-09-30 DIAGNOSIS — E119 Type 2 diabetes mellitus without complications: Secondary | ICD-10-CM | POA: Diagnosis not present

## 2023-09-30 DIAGNOSIS — J449 Chronic obstructive pulmonary disease, unspecified: Secondary | ICD-10-CM | POA: Diagnosis not present

## 2023-09-30 DIAGNOSIS — R54 Age-related physical debility: Secondary | ICD-10-CM | POA: Diagnosis not present

## 2023-09-30 DIAGNOSIS — Z8709 Personal history of other diseases of the respiratory system: Secondary | ICD-10-CM | POA: Diagnosis not present

## 2023-09-30 DIAGNOSIS — J189 Pneumonia, unspecified organism: Secondary | ICD-10-CM | POA: Diagnosis not present

## 2023-09-30 DIAGNOSIS — R531 Weakness: Secondary | ICD-10-CM | POA: Diagnosis not present

## 2023-09-30 DIAGNOSIS — I129 Hypertensive chronic kidney disease with stage 1 through stage 4 chronic kidney disease, or unspecified chronic kidney disease: Secondary | ICD-10-CM | POA: Diagnosis not present

## 2023-09-30 DIAGNOSIS — N179 Acute kidney failure, unspecified: Secondary | ICD-10-CM | POA: Diagnosis not present

## 2023-09-30 DIAGNOSIS — J1289 Other viral pneumonia: Secondary | ICD-10-CM | POA: Diagnosis not present

## 2023-09-30 DIAGNOSIS — N170 Acute kidney failure with tubular necrosis: Secondary | ICD-10-CM | POA: Diagnosis not present

## 2023-09-30 DIAGNOSIS — Z515 Encounter for palliative care: Secondary | ICD-10-CM | POA: Diagnosis not present

## 2023-09-30 DIAGNOSIS — Z79899 Other long term (current) drug therapy: Secondary | ICD-10-CM | POA: Diagnosis not present

## 2023-09-30 DIAGNOSIS — I480 Paroxysmal atrial fibrillation: Secondary | ICD-10-CM | POA: Diagnosis not present

## 2023-09-30 DIAGNOSIS — I10 Essential (primary) hypertension: Secondary | ICD-10-CM | POA: Diagnosis not present

## 2023-10-05 DIAGNOSIS — J1289 Other viral pneumonia: Secondary | ICD-10-CM | POA: Diagnosis not present

## 2023-10-11 ENCOUNTER — Ambulatory Visit (INDEPENDENT_AMBULATORY_CARE_PROVIDER_SITE_OTHER): Payer: Medicare HMO | Admitting: Gastroenterology

## 2023-10-14 DIAGNOSIS — Z515 Encounter for palliative care: Secondary | ICD-10-CM | POA: Diagnosis not present

## 2023-10-14 DIAGNOSIS — J449 Chronic obstructive pulmonary disease, unspecified: Secondary | ICD-10-CM | POA: Diagnosis not present

## 2023-10-18 DIAGNOSIS — R531 Weakness: Secondary | ICD-10-CM | POA: Diagnosis not present

## 2023-10-19 NOTE — Patient Instructions (Signed)
 ca

## 2023-10-19 NOTE — Transitions of Care (Post Inpatient/ED Visit) (Signed)
   10/19/2023  Name: Taylor Shields MRN: [id number] DOB: 05/18/1951  Today's TOC FU Call Status:   Patient chart review for TOC.  Bary Leriche RN, MSN [facility], [facility] RN Care Manager Direct Dial: [phone]  Fax: [phone] Website: [email or web address]

## 2023-10-20 DIAGNOSIS — Z299 Encounter for prophylactic measures, unspecified: Secondary | ICD-10-CM | POA: Diagnosis not present

## 2023-10-20 DIAGNOSIS — I10 Essential (primary) hypertension: Secondary | ICD-10-CM | POA: Diagnosis not present

## 2023-10-20 DIAGNOSIS — G4700 Insomnia, unspecified: Secondary | ICD-10-CM | POA: Diagnosis not present

## 2023-10-20 DIAGNOSIS — K219 Gastro-esophageal reflux disease without esophagitis: Secondary | ICD-10-CM | POA: Diagnosis not present

## 2023-10-22 DIAGNOSIS — J9601 Acute respiratory failure with hypoxia: Secondary | ICD-10-CM | POA: Diagnosis not present

## 2023-10-22 DIAGNOSIS — J101 Influenza due to other identified influenza virus with other respiratory manifestations: Secondary | ICD-10-CM | POA: Diagnosis not present

## 2023-10-22 DIAGNOSIS — N182 Chronic kidney disease, stage 2 (mild): Secondary | ICD-10-CM | POA: Diagnosis not present

## 2023-10-22 DIAGNOSIS — E1122 Type 2 diabetes mellitus with diabetic chronic kidney disease: Secondary | ICD-10-CM | POA: Diagnosis not present

## 2023-10-22 DIAGNOSIS — I872 Venous insufficiency (chronic) (peripheral): Secondary | ICD-10-CM | POA: Diagnosis not present

## 2023-10-22 DIAGNOSIS — E1151 Type 2 diabetes mellitus with diabetic peripheral angiopathy without gangrene: Secondary | ICD-10-CM | POA: Diagnosis not present

## 2023-10-22 DIAGNOSIS — I4891 Unspecified atrial fibrillation: Secondary | ICD-10-CM | POA: Diagnosis not present

## 2023-10-22 DIAGNOSIS — I129 Hypertensive chronic kidney disease with stage 1 through stage 4 chronic kidney disease, or unspecified chronic kidney disease: Secondary | ICD-10-CM | POA: Diagnosis not present

## 2023-10-22 DIAGNOSIS — J441 Chronic obstructive pulmonary disease with (acute) exacerbation: Secondary | ICD-10-CM | POA: Diagnosis not present

## 2023-10-26 DIAGNOSIS — E1122 Type 2 diabetes mellitus with diabetic chronic kidney disease: Secondary | ICD-10-CM | POA: Diagnosis not present

## 2023-10-26 DIAGNOSIS — J101 Influenza due to other identified influenza virus with other respiratory manifestations: Secondary | ICD-10-CM | POA: Diagnosis not present

## 2023-10-26 DIAGNOSIS — J441 Chronic obstructive pulmonary disease with (acute) exacerbation: Secondary | ICD-10-CM | POA: Diagnosis not present

## 2023-10-26 DIAGNOSIS — I872 Venous insufficiency (chronic) (peripheral): Secondary | ICD-10-CM | POA: Diagnosis not present

## 2023-10-26 DIAGNOSIS — I4891 Unspecified atrial fibrillation: Secondary | ICD-10-CM | POA: Diagnosis not present

## 2023-10-26 DIAGNOSIS — E1151 Type 2 diabetes mellitus with diabetic peripheral angiopathy without gangrene: Secondary | ICD-10-CM | POA: Diagnosis not present

## 2023-10-26 DIAGNOSIS — J9601 Acute respiratory failure with hypoxia: Secondary | ICD-10-CM | POA: Diagnosis not present

## 2023-10-26 DIAGNOSIS — I129 Hypertensive chronic kidney disease with stage 1 through stage 4 chronic kidney disease, or unspecified chronic kidney disease: Secondary | ICD-10-CM | POA: Diagnosis not present

## 2023-10-26 DIAGNOSIS — N182 Chronic kidney disease, stage 2 (mild): Secondary | ICD-10-CM | POA: Diagnosis not present

## 2023-10-27 DIAGNOSIS — I872 Venous insufficiency (chronic) (peripheral): Secondary | ICD-10-CM | POA: Diagnosis not present

## 2023-10-27 DIAGNOSIS — E1122 Type 2 diabetes mellitus with diabetic chronic kidney disease: Secondary | ICD-10-CM | POA: Diagnosis not present

## 2023-10-27 DIAGNOSIS — N182 Chronic kidney disease, stage 2 (mild): Secondary | ICD-10-CM | POA: Diagnosis not present

## 2023-10-27 DIAGNOSIS — J441 Chronic obstructive pulmonary disease with (acute) exacerbation: Secondary | ICD-10-CM | POA: Diagnosis not present

## 2023-10-27 DIAGNOSIS — I129 Hypertensive chronic kidney disease with stage 1 through stage 4 chronic kidney disease, or unspecified chronic kidney disease: Secondary | ICD-10-CM | POA: Diagnosis not present

## 2023-10-27 DIAGNOSIS — E1151 Type 2 diabetes mellitus with diabetic peripheral angiopathy without gangrene: Secondary | ICD-10-CM | POA: Diagnosis not present

## 2023-10-27 DIAGNOSIS — J101 Influenza due to other identified influenza virus with other respiratory manifestations: Secondary | ICD-10-CM | POA: Diagnosis not present

## 2023-10-27 DIAGNOSIS — J9601 Acute respiratory failure with hypoxia: Secondary | ICD-10-CM | POA: Diagnosis not present

## 2023-10-27 DIAGNOSIS — I4891 Unspecified atrial fibrillation: Secondary | ICD-10-CM | POA: Diagnosis not present

## 2023-11-02 DIAGNOSIS — I1A0 Resistant hypertension: Secondary | ICD-10-CM | POA: Diagnosis not present

## 2023-11-03 DIAGNOSIS — J9601 Acute respiratory failure with hypoxia: Secondary | ICD-10-CM | POA: Diagnosis not present

## 2023-11-03 DIAGNOSIS — E1122 Type 2 diabetes mellitus with diabetic chronic kidney disease: Secondary | ICD-10-CM | POA: Diagnosis not present

## 2023-11-03 DIAGNOSIS — I872 Venous insufficiency (chronic) (peripheral): Secondary | ICD-10-CM | POA: Diagnosis not present

## 2023-11-03 DIAGNOSIS — J101 Influenza due to other identified influenza virus with other respiratory manifestations: Secondary | ICD-10-CM | POA: Diagnosis not present

## 2023-11-03 DIAGNOSIS — I129 Hypertensive chronic kidney disease with stage 1 through stage 4 chronic kidney disease, or unspecified chronic kidney disease: Secondary | ICD-10-CM | POA: Diagnosis not present

## 2023-11-03 DIAGNOSIS — N182 Chronic kidney disease, stage 2 (mild): Secondary | ICD-10-CM | POA: Diagnosis not present

## 2023-11-03 DIAGNOSIS — J441 Chronic obstructive pulmonary disease with (acute) exacerbation: Secondary | ICD-10-CM | POA: Diagnosis not present

## 2023-11-03 DIAGNOSIS — I4891 Unspecified atrial fibrillation: Secondary | ICD-10-CM | POA: Diagnosis not present

## 2023-11-03 DIAGNOSIS — E1151 Type 2 diabetes mellitus with diabetic peripheral angiopathy without gangrene: Secondary | ICD-10-CM | POA: Diagnosis not present

## 2023-11-04 DIAGNOSIS — I4891 Unspecified atrial fibrillation: Secondary | ICD-10-CM | POA: Diagnosis not present

## 2023-11-04 DIAGNOSIS — E1151 Type 2 diabetes mellitus with diabetic peripheral angiopathy without gangrene: Secondary | ICD-10-CM | POA: Diagnosis not present

## 2023-11-04 DIAGNOSIS — E1122 Type 2 diabetes mellitus with diabetic chronic kidney disease: Secondary | ICD-10-CM | POA: Diagnosis not present

## 2023-11-04 DIAGNOSIS — I129 Hypertensive chronic kidney disease with stage 1 through stage 4 chronic kidney disease, or unspecified chronic kidney disease: Secondary | ICD-10-CM | POA: Diagnosis not present

## 2023-11-04 DIAGNOSIS — J9601 Acute respiratory failure with hypoxia: Secondary | ICD-10-CM | POA: Diagnosis not present

## 2023-11-04 DIAGNOSIS — N182 Chronic kidney disease, stage 2 (mild): Secondary | ICD-10-CM | POA: Diagnosis not present

## 2023-11-04 DIAGNOSIS — I872 Venous insufficiency (chronic) (peripheral): Secondary | ICD-10-CM | POA: Diagnosis not present

## 2023-11-04 DIAGNOSIS — J441 Chronic obstructive pulmonary disease with (acute) exacerbation: Secondary | ICD-10-CM | POA: Diagnosis not present

## 2023-11-04 DIAGNOSIS — J101 Influenza due to other identified influenza virus with other respiratory manifestations: Secondary | ICD-10-CM | POA: Diagnosis not present

## 2023-11-05 DIAGNOSIS — J101 Influenza due to other identified influenza virus with other respiratory manifestations: Secondary | ICD-10-CM | POA: Diagnosis not present

## 2023-11-05 DIAGNOSIS — N182 Chronic kidney disease, stage 2 (mild): Secondary | ICD-10-CM | POA: Diagnosis not present

## 2023-11-05 DIAGNOSIS — I4891 Unspecified atrial fibrillation: Secondary | ICD-10-CM | POA: Diagnosis not present

## 2023-11-05 DIAGNOSIS — I129 Hypertensive chronic kidney disease with stage 1 through stage 4 chronic kidney disease, or unspecified chronic kidney disease: Secondary | ICD-10-CM | POA: Diagnosis not present

## 2023-11-05 DIAGNOSIS — E1151 Type 2 diabetes mellitus with diabetic peripheral angiopathy without gangrene: Secondary | ICD-10-CM | POA: Diagnosis not present

## 2023-11-05 DIAGNOSIS — J441 Chronic obstructive pulmonary disease with (acute) exacerbation: Secondary | ICD-10-CM | POA: Diagnosis not present

## 2023-11-05 DIAGNOSIS — E1122 Type 2 diabetes mellitus with diabetic chronic kidney disease: Secondary | ICD-10-CM | POA: Diagnosis not present

## 2023-11-05 DIAGNOSIS — I872 Venous insufficiency (chronic) (peripheral): Secondary | ICD-10-CM | POA: Diagnosis not present

## 2023-11-05 DIAGNOSIS — J9601 Acute respiratory failure with hypoxia: Secondary | ICD-10-CM | POA: Diagnosis not present

## 2023-11-15 DIAGNOSIS — J9601 Acute respiratory failure with hypoxia: Secondary | ICD-10-CM | POA: Diagnosis not present

## 2023-11-15 DIAGNOSIS — N182 Chronic kidney disease, stage 2 (mild): Secondary | ICD-10-CM | POA: Diagnosis not present

## 2023-11-15 DIAGNOSIS — I129 Hypertensive chronic kidney disease with stage 1 through stage 4 chronic kidney disease, or unspecified chronic kidney disease: Secondary | ICD-10-CM | POA: Diagnosis not present

## 2023-11-15 DIAGNOSIS — J101 Influenza due to other identified influenza virus with other respiratory manifestations: Secondary | ICD-10-CM | POA: Diagnosis not present

## 2023-11-15 DIAGNOSIS — I4891 Unspecified atrial fibrillation: Secondary | ICD-10-CM | POA: Diagnosis not present

## 2023-11-15 DIAGNOSIS — I872 Venous insufficiency (chronic) (peripheral): Secondary | ICD-10-CM | POA: Diagnosis not present

## 2023-11-15 DIAGNOSIS — J441 Chronic obstructive pulmonary disease with (acute) exacerbation: Secondary | ICD-10-CM | POA: Diagnosis not present

## 2023-11-15 DIAGNOSIS — E1151 Type 2 diabetes mellitus with diabetic peripheral angiopathy without gangrene: Secondary | ICD-10-CM | POA: Diagnosis not present

## 2023-11-15 DIAGNOSIS — E1122 Type 2 diabetes mellitus with diabetic chronic kidney disease: Secondary | ICD-10-CM | POA: Diagnosis not present

## 2023-11-16 DIAGNOSIS — E1122 Type 2 diabetes mellitus with diabetic chronic kidney disease: Secondary | ICD-10-CM | POA: Diagnosis not present

## 2023-11-16 DIAGNOSIS — J441 Chronic obstructive pulmonary disease with (acute) exacerbation: Secondary | ICD-10-CM | POA: Diagnosis not present

## 2023-11-16 DIAGNOSIS — I129 Hypertensive chronic kidney disease with stage 1 through stage 4 chronic kidney disease, or unspecified chronic kidney disease: Secondary | ICD-10-CM | POA: Diagnosis not present

## 2023-11-16 DIAGNOSIS — J101 Influenza due to other identified influenza virus with other respiratory manifestations: Secondary | ICD-10-CM | POA: Diagnosis not present

## 2023-11-16 DIAGNOSIS — E1151 Type 2 diabetes mellitus with diabetic peripheral angiopathy without gangrene: Secondary | ICD-10-CM | POA: Diagnosis not present

## 2023-11-16 DIAGNOSIS — I4891 Unspecified atrial fibrillation: Secondary | ICD-10-CM | POA: Diagnosis not present

## 2023-11-16 DIAGNOSIS — N182 Chronic kidney disease, stage 2 (mild): Secondary | ICD-10-CM | POA: Diagnosis not present

## 2023-11-16 DIAGNOSIS — I872 Venous insufficiency (chronic) (peripheral): Secondary | ICD-10-CM | POA: Diagnosis not present

## 2023-11-16 DIAGNOSIS — J9601 Acute respiratory failure with hypoxia: Secondary | ICD-10-CM | POA: Diagnosis not present

## 2023-11-19 DIAGNOSIS — I4891 Unspecified atrial fibrillation: Secondary | ICD-10-CM | POA: Diagnosis not present

## 2023-11-19 DIAGNOSIS — I872 Venous insufficiency (chronic) (peripheral): Secondary | ICD-10-CM | POA: Diagnosis not present

## 2023-11-19 DIAGNOSIS — J9601 Acute respiratory failure with hypoxia: Secondary | ICD-10-CM | POA: Diagnosis not present

## 2023-11-19 DIAGNOSIS — J441 Chronic obstructive pulmonary disease with (acute) exacerbation: Secondary | ICD-10-CM | POA: Diagnosis not present

## 2023-11-19 DIAGNOSIS — N182 Chronic kidney disease, stage 2 (mild): Secondary | ICD-10-CM | POA: Diagnosis not present

## 2023-11-19 DIAGNOSIS — I129 Hypertensive chronic kidney disease with stage 1 through stage 4 chronic kidney disease, or unspecified chronic kidney disease: Secondary | ICD-10-CM | POA: Diagnosis not present

## 2023-11-19 DIAGNOSIS — J101 Influenza due to other identified influenza virus with other respiratory manifestations: Secondary | ICD-10-CM | POA: Diagnosis not present

## 2023-11-19 DIAGNOSIS — E1122 Type 2 diabetes mellitus with diabetic chronic kidney disease: Secondary | ICD-10-CM | POA: Diagnosis not present

## 2023-11-19 DIAGNOSIS — E1151 Type 2 diabetes mellitus with diabetic peripheral angiopathy without gangrene: Secondary | ICD-10-CM | POA: Diagnosis not present

## 2023-11-21 DIAGNOSIS — I872 Venous insufficiency (chronic) (peripheral): Secondary | ICD-10-CM | POA: Diagnosis not present

## 2023-11-21 DIAGNOSIS — E1151 Type 2 diabetes mellitus with diabetic peripheral angiopathy without gangrene: Secondary | ICD-10-CM | POA: Diagnosis not present

## 2023-11-21 DIAGNOSIS — I4891 Unspecified atrial fibrillation: Secondary | ICD-10-CM | POA: Diagnosis not present

## 2023-11-21 DIAGNOSIS — I129 Hypertensive chronic kidney disease with stage 1 through stage 4 chronic kidney disease, or unspecified chronic kidney disease: Secondary | ICD-10-CM | POA: Diagnosis not present

## 2023-11-21 DIAGNOSIS — E1122 Type 2 diabetes mellitus with diabetic chronic kidney disease: Secondary | ICD-10-CM | POA: Diagnosis not present

## 2023-11-21 DIAGNOSIS — N182 Chronic kidney disease, stage 2 (mild): Secondary | ICD-10-CM | POA: Diagnosis not present

## 2023-11-21 DIAGNOSIS — J101 Influenza due to other identified influenza virus with other respiratory manifestations: Secondary | ICD-10-CM | POA: Diagnosis not present

## 2023-11-21 DIAGNOSIS — J9601 Acute respiratory failure with hypoxia: Secondary | ICD-10-CM | POA: Diagnosis not present

## 2023-11-21 DIAGNOSIS — J441 Chronic obstructive pulmonary disease with (acute) exacerbation: Secondary | ICD-10-CM | POA: Diagnosis not present

## 2023-11-23 DIAGNOSIS — E1122 Type 2 diabetes mellitus with diabetic chronic kidney disease: Secondary | ICD-10-CM | POA: Diagnosis not present

## 2023-11-23 DIAGNOSIS — I872 Venous insufficiency (chronic) (peripheral): Secondary | ICD-10-CM | POA: Diagnosis not present

## 2023-11-23 DIAGNOSIS — N182 Chronic kidney disease, stage 2 (mild): Secondary | ICD-10-CM | POA: Diagnosis not present

## 2023-11-23 DIAGNOSIS — I129 Hypertensive chronic kidney disease with stage 1 through stage 4 chronic kidney disease, or unspecified chronic kidney disease: Secondary | ICD-10-CM | POA: Diagnosis not present

## 2023-11-23 DIAGNOSIS — E1151 Type 2 diabetes mellitus with diabetic peripheral angiopathy without gangrene: Secondary | ICD-10-CM | POA: Diagnosis not present

## 2023-11-23 DIAGNOSIS — J101 Influenza due to other identified influenza virus with other respiratory manifestations: Secondary | ICD-10-CM | POA: Diagnosis not present

## 2023-11-23 DIAGNOSIS — I4891 Unspecified atrial fibrillation: Secondary | ICD-10-CM | POA: Diagnosis not present

## 2023-11-23 DIAGNOSIS — J441 Chronic obstructive pulmonary disease with (acute) exacerbation: Secondary | ICD-10-CM | POA: Diagnosis not present

## 2023-11-23 DIAGNOSIS — J9601 Acute respiratory failure with hypoxia: Secondary | ICD-10-CM | POA: Diagnosis not present

## 2023-11-25 DIAGNOSIS — J101 Influenza due to other identified influenza virus with other respiratory manifestations: Secondary | ICD-10-CM | POA: Diagnosis not present

## 2023-11-25 DIAGNOSIS — E1151 Type 2 diabetes mellitus with diabetic peripheral angiopathy without gangrene: Secondary | ICD-10-CM | POA: Diagnosis not present

## 2023-11-25 DIAGNOSIS — I129 Hypertensive chronic kidney disease with stage 1 through stage 4 chronic kidney disease, or unspecified chronic kidney disease: Secondary | ICD-10-CM | POA: Diagnosis not present

## 2023-11-25 DIAGNOSIS — N182 Chronic kidney disease, stage 2 (mild): Secondary | ICD-10-CM | POA: Diagnosis not present

## 2023-11-25 DIAGNOSIS — I872 Venous insufficiency (chronic) (peripheral): Secondary | ICD-10-CM | POA: Diagnosis not present

## 2023-11-25 DIAGNOSIS — I4891 Unspecified atrial fibrillation: Secondary | ICD-10-CM | POA: Diagnosis not present

## 2023-11-25 DIAGNOSIS — E1122 Type 2 diabetes mellitus with diabetic chronic kidney disease: Secondary | ICD-10-CM | POA: Diagnosis not present

## 2023-11-25 DIAGNOSIS — J441 Chronic obstructive pulmonary disease with (acute) exacerbation: Secondary | ICD-10-CM | POA: Diagnosis not present

## 2023-11-25 DIAGNOSIS — J9601 Acute respiratory failure with hypoxia: Secondary | ICD-10-CM | POA: Diagnosis not present

## 2023-11-29 DIAGNOSIS — E1122 Type 2 diabetes mellitus with diabetic chronic kidney disease: Secondary | ICD-10-CM | POA: Diagnosis not present

## 2023-11-29 DIAGNOSIS — N182 Chronic kidney disease, stage 2 (mild): Secondary | ICD-10-CM | POA: Diagnosis not present

## 2023-11-29 DIAGNOSIS — J9601 Acute respiratory failure with hypoxia: Secondary | ICD-10-CM | POA: Diagnosis not present

## 2023-11-29 DIAGNOSIS — J101 Influenza due to other identified influenza virus with other respiratory manifestations: Secondary | ICD-10-CM | POA: Diagnosis not present

## 2023-11-29 DIAGNOSIS — J441 Chronic obstructive pulmonary disease with (acute) exacerbation: Secondary | ICD-10-CM | POA: Diagnosis not present

## 2023-11-29 DIAGNOSIS — I129 Hypertensive chronic kidney disease with stage 1 through stage 4 chronic kidney disease, or unspecified chronic kidney disease: Secondary | ICD-10-CM | POA: Diagnosis not present

## 2023-11-29 DIAGNOSIS — I4891 Unspecified atrial fibrillation: Secondary | ICD-10-CM | POA: Diagnosis not present

## 2023-11-29 DIAGNOSIS — I872 Venous insufficiency (chronic) (peripheral): Secondary | ICD-10-CM | POA: Diagnosis not present

## 2023-11-29 DIAGNOSIS — E1151 Type 2 diabetes mellitus with diabetic peripheral angiopathy without gangrene: Secondary | ICD-10-CM | POA: Diagnosis not present

## 2023-11-30 DIAGNOSIS — I129 Hypertensive chronic kidney disease with stage 1 through stage 4 chronic kidney disease, or unspecified chronic kidney disease: Secondary | ICD-10-CM | POA: Diagnosis not present

## 2023-11-30 DIAGNOSIS — E1122 Type 2 diabetes mellitus with diabetic chronic kidney disease: Secondary | ICD-10-CM | POA: Diagnosis not present

## 2023-11-30 DIAGNOSIS — N182 Chronic kidney disease, stage 2 (mild): Secondary | ICD-10-CM | POA: Diagnosis not present

## 2023-11-30 DIAGNOSIS — E1151 Type 2 diabetes mellitus with diabetic peripheral angiopathy without gangrene: Secondary | ICD-10-CM | POA: Diagnosis not present

## 2023-11-30 DIAGNOSIS — J441 Chronic obstructive pulmonary disease with (acute) exacerbation: Secondary | ICD-10-CM | POA: Diagnosis not present

## 2023-11-30 DIAGNOSIS — I4891 Unspecified atrial fibrillation: Secondary | ICD-10-CM | POA: Diagnosis not present

## 2023-11-30 DIAGNOSIS — I872 Venous insufficiency (chronic) (peripheral): Secondary | ICD-10-CM | POA: Diagnosis not present

## 2023-11-30 DIAGNOSIS — J101 Influenza due to other identified influenza virus with other respiratory manifestations: Secondary | ICD-10-CM | POA: Diagnosis not present

## 2023-11-30 DIAGNOSIS — J9601 Acute respiratory failure with hypoxia: Secondary | ICD-10-CM | POA: Diagnosis not present

## 2023-12-08 DIAGNOSIS — I129 Hypertensive chronic kidney disease with stage 1 through stage 4 chronic kidney disease, or unspecified chronic kidney disease: Secondary | ICD-10-CM | POA: Diagnosis not present

## 2023-12-08 DIAGNOSIS — N182 Chronic kidney disease, stage 2 (mild): Secondary | ICD-10-CM | POA: Diagnosis not present

## 2023-12-08 DIAGNOSIS — E1122 Type 2 diabetes mellitus with diabetic chronic kidney disease: Secondary | ICD-10-CM | POA: Diagnosis not present

## 2023-12-08 DIAGNOSIS — J101 Influenza due to other identified influenza virus with other respiratory manifestations: Secondary | ICD-10-CM | POA: Diagnosis not present

## 2023-12-08 DIAGNOSIS — J441 Chronic obstructive pulmonary disease with (acute) exacerbation: Secondary | ICD-10-CM | POA: Diagnosis not present

## 2023-12-08 DIAGNOSIS — I4891 Unspecified atrial fibrillation: Secondary | ICD-10-CM | POA: Diagnosis not present

## 2023-12-08 DIAGNOSIS — J9601 Acute respiratory failure with hypoxia: Secondary | ICD-10-CM | POA: Diagnosis not present

## 2023-12-08 DIAGNOSIS — I872 Venous insufficiency (chronic) (peripheral): Secondary | ICD-10-CM | POA: Diagnosis not present

## 2023-12-08 DIAGNOSIS — E1151 Type 2 diabetes mellitus with diabetic peripheral angiopathy without gangrene: Secondary | ICD-10-CM | POA: Diagnosis not present

## 2023-12-20 DIAGNOSIS — I4891 Unspecified atrial fibrillation: Secondary | ICD-10-CM | POA: Diagnosis not present

## 2023-12-20 DIAGNOSIS — E1151 Type 2 diabetes mellitus with diabetic peripheral angiopathy without gangrene: Secondary | ICD-10-CM | POA: Diagnosis not present

## 2023-12-20 DIAGNOSIS — I129 Hypertensive chronic kidney disease with stage 1 through stage 4 chronic kidney disease, or unspecified chronic kidney disease: Secondary | ICD-10-CM | POA: Diagnosis not present

## 2023-12-20 DIAGNOSIS — I872 Venous insufficiency (chronic) (peripheral): Secondary | ICD-10-CM | POA: Diagnosis not present

## 2023-12-20 DIAGNOSIS — J101 Influenza due to other identified influenza virus with other respiratory manifestations: Secondary | ICD-10-CM | POA: Diagnosis not present

## 2023-12-20 DIAGNOSIS — J441 Chronic obstructive pulmonary disease with (acute) exacerbation: Secondary | ICD-10-CM | POA: Diagnosis not present

## 2023-12-20 DIAGNOSIS — N182 Chronic kidney disease, stage 2 (mild): Secondary | ICD-10-CM | POA: Diagnosis not present

## 2023-12-20 DIAGNOSIS — J9601 Acute respiratory failure with hypoxia: Secondary | ICD-10-CM | POA: Diagnosis not present

## 2023-12-20 DIAGNOSIS — E1122 Type 2 diabetes mellitus with diabetic chronic kidney disease: Secondary | ICD-10-CM | POA: Diagnosis not present

## 2023-12-21 DIAGNOSIS — K5909 Other constipation: Secondary | ICD-10-CM | POA: Diagnosis not present

## 2023-12-21 DIAGNOSIS — J9611 Chronic respiratory failure with hypoxia: Secondary | ICD-10-CM | POA: Diagnosis not present

## 2023-12-21 DIAGNOSIS — E1169 Type 2 diabetes mellitus with other specified complication: Secondary | ICD-10-CM | POA: Diagnosis not present

## 2023-12-21 DIAGNOSIS — J449 Chronic obstructive pulmonary disease, unspecified: Secondary | ICD-10-CM | POA: Diagnosis not present

## 2023-12-21 DIAGNOSIS — I10 Essential (primary) hypertension: Secondary | ICD-10-CM | POA: Diagnosis not present

## 2023-12-21 DIAGNOSIS — Z299 Encounter for prophylactic measures, unspecified: Secondary | ICD-10-CM | POA: Diagnosis not present

## 2023-12-22 DIAGNOSIS — E1169 Type 2 diabetes mellitus with other specified complication: Secondary | ICD-10-CM | POA: Diagnosis not present

## 2024-01-06 ENCOUNTER — Encounter (INDEPENDENT_AMBULATORY_CARE_PROVIDER_SITE_OTHER): Payer: Self-pay | Admitting: Gastroenterology

## 2024-01-06 ENCOUNTER — Ambulatory Visit (INDEPENDENT_AMBULATORY_CARE_PROVIDER_SITE_OTHER): Admitting: Gastroenterology

## 2024-01-06 VITALS — BP 112/69 | HR 57 | Temp 97.6°F | Ht 61.5 in | Wt 138.0 lb

## 2024-01-06 DIAGNOSIS — K219 Gastro-esophageal reflux disease without esophagitis: Secondary | ICD-10-CM | POA: Diagnosis not present

## 2024-01-06 DIAGNOSIS — K5900 Constipation, unspecified: Secondary | ICD-10-CM

## 2024-01-06 MED ORDER — LACTULOSE 10 GM/15ML PO SOLN: 30.0000 g | Freq: Two times a day (BID) | ORAL | 2 refills | Status: AC

## 2024-01-06 NOTE — Progress Notes (Addendum)
 Referring Provider: Leavy Waddell NOVAK, FNP Primary Care Physician:  Leavy Waddell NOVAK, FNP Primary GI Physician: Dr. Eartha   Chief Complaint  Patient presents with   Follow-up    Pt arrives for follow up. Pt states she had one occurrence where she had a hard ball come out and caused pain, but that has not happened any longer. Pt has been taking her med to help with constipation.    HPI:   Taylor Shields is a 73 y.o. female with past medical history of athma, COPD, Gout, HTN, CHF   Patient presenting today for:  Follow up of constipation and GERD/Globus sensation:  Last seen December 2024, at that time doing prune juice, lactulose  which seem to be helping her bowels move, did not feel linzess helped much. Took mirlax BID x1 week without much improvement. Reports globus sensation and coughing up phlegm at times as well, currently on nexium   Recommended to resume lactulose , good water  intake, high fiber diet, stop nexium, start protonix , consider colonoscopy/flex sig  Present:  She states she is taking lactulose  daily which seems to help her constipation. She notes she did have an episode last month where she went to the restroom and had some softer stools but had a subsequent harder, large stool she had to break apart with gloves to get it to pass down the toilet. Also reports one episode of fecal urgency where she had to run to the restroom but she had taken lactulose  and notes her daughter just poured a larger amount, she had also took 2 stool softeners, denies other episodes like this.  She typically has a BM daily but sometimes still notes she has to strain and sometimes stools are smaller in volume. No abdominal pain. She wonders if she needs to be taking more lactulose  as she is taking it twice per day. No blood in stools or melena. Water  intake is good, drinking 8-10 small bottles per day. Is trying to eat plenty of fruits. Appetite is good. No nausea, vomiting. Very  rare reflux symptoms on protonix  40mg  daily. She has some occasional phlegm in the back of her throat when she drinks sodas but no issues with water  or foods, no dysphagia or odynophagia. She notes she tries not to overeat or she will feel full.    US  Abd Complete: 02/2023 unremarkable  CT A/P with contrast 01/2023: Motion degradation limits the exam.  Negative for hydronephrosis or bowel obstruction. Slightly thick-walled bladder, correlate with urinalysis to exclude cystitis. 14 mm indeterminate hypodense lesion posterior cortex left  kidney. Suggest further evaluation with nonemergent renal ultrasound  Aortic atherosclerosis.  Last Colonoscopy:01/2021  - Rectal prolapse found on perianal exam.                           - Two 4 to 12 mm polyps in the ascending colon,                            removed with a cold snare. Resected and retrieved.                           - One 2 mm polyp in the ascending colon, removed                            with a  cold biopsy forceps. Resected and retrieved.                           - Scar in the descending colon.                           - Diverticulosis in the sigmoid colon, in the                            descending colon, in the transverse colon and in                            the ascending colon.                           - The distal rectum and anal verge are normal on                            retroflexion view. 3 tubular adenomas, repeat colonoscopy in 5 years (needs two day prep)  Past Medical History:  Diagnosis Date   Asthma    CHF (congestive heart failure) (HCC)    COPD (chronic obstructive pulmonary disease) (HCC)    Gout    Hypertension     Past Surgical History:  Procedure Laterality Date   ABDOMINAL HYSTERECTOMY     CARPAL TUNNEL RELEASE Bilateral    COLON SURGERY     COLONOSCOPY WITH PROPOFOL  N/A 01/29/2021   Procedure: COLONOSCOPY WITH PROPOFOL ;  Surgeon: Eartha Angelia Sieving, MD;  [facility];  Service:  Gastroenterology;  Laterality: N/A;  7:30 am   LIPOMA EXCISION     PARTIAL HYSTERECTOMY     POLYPECTOMY  01/29/2021   Procedure: POLYPECTOMY INTESTINAL;  Surgeon: Eartha Angelia, Sieving, MD;  Location: [facility];  Service: Gastroenterology;;    Current Outpatient Medications  Medication Sig Dispense Refill   albuterol  (PROAIR  HFA) 108 (90 BASE) MCG/ACT inhaler Inhale 1 puff into the lungs every 6 (six) hours as needed for wheezing or shortness of breath.     alendronate (FOSAMAX) 70 MG tablet Take 70 mg by mouth once a week. Take with a full glass of water  on an empty stomach.     allopurinol (ZYLOPRIM) 300 MG tablet Take 300 mg by mouth daily.     calcium-vitamin D (OSCAL WITH D) 500-200 MG-UNIT tablet Take 1 tablet by mouth daily with breakfast.     Cholecalciferol (VITAMIN D3) 50 MCG (2000 UT) TABS Take by mouth daily at 6 (six) AM.     cloNIDine (CATAPRES) 0.2 MG tablet Take 0.2 mg by mouth 2 (two) times daily.     diltiazem (TIADYLT ER) 360 MG 24 hr capsule Take 360 mg by mouth daily.     DiphenhydrAMINE HCl (ALLERGY MEDICATION PO) Take 1 tablet by mouth daily.     furosemide (LASIX) 40 MG tablet Take 40 mg by mouth.     guaiFENesin (MUCINEX) 600 MG 12 hr tablet Take 1,200 mg by mouth 2 (two) times daily.     labetalol (NORMODYNE) 300 MG tablet Take 300 mg by mouth 2 (two) times daily.     lactulose  (CHRONULAC ) 10 GM/15ML solution Take 30 mLs (20 g total) by mouth 2 (two) times daily. 1892 mL 2  metFORMIN (GLUCOPHAGE-XR) 500 MG 24 hr tablet Take 500 mg by mouth daily with breakfast.     pantoprazole  (PROTONIX ) 40 MG tablet TAKE 1 TABLET BY MOUTH DAILY 60 tablet 2   polyethylene glycol (MIRALAX / GLYCOLAX) 17 g packet Take 17 g by mouth 2 (two) times daily.     simvastatin (ZOCOR) 20 MG tablet Take 20 mg by mouth daily.     No current facility-administered medications for this visit.    Allergies as of 01/06/2024 - Review Complete 01/06/2024  Allergen Reaction Noted    Penicillins Itching 11/25/2013    Social History   Socioeconomic History   Marital status: Widowed    Spouse name: Not on file   Number of children: Not on file   Years of education: Not on file   Highest education level: Not on file  Occupational History   Not on file  Tobacco Use   Smoking status: Never   Smokeless tobacco: Never  Vaping Use   Vaping status: Never Used  Substance and Sexual Activity   Alcohol  use: No   Drug use: No   Sexual activity: Yes    Birth control/protection: Surgical  Other Topics Concern   Not on file  Social History Narrative   Not on file   Social Drivers of Health   Financial Resource Strain: Low Risk  (09/27/2023)   Received from [facility]   Overall Financial Resource Strain (CARDIA)    Difficulty of Paying Living Expenses: Not hard at all  Food Insecurity: No Food Insecurity (09/27/2023)   [facility]   Hunger Vital Sign    Within the past 12 months, you worried that your food would run out before you got the money to buy more.: Never true    Within the past 12 months, the food you bought just didn't last and you didn't have money to get more.: Never true  Transportation Needs: No Transportation Needs (10/07/2023)   Received from [facility]   PRAPARE - Transportation    Lack of Transportation (Medical): No    Lack of Transportation (Non-Medical): No  Physical Activity: Inactive (09/27/2023)   Received from [facility]   Exercise Vital Sign    On average, how many days per week do you engage in moderate to strenuous exercise (like a brisk walk)?: 0 days    On average, how many minutes do you engage in exercise at this level?: 0 min  Stress: No Stress Concern Present (09/27/2023)   Received from [facility] - Occupational Stress Questionnaire    Feeling of Stress : Only a little  Social Connections: Moderately Integrated (09/27/2023)   Received from [facility]   Social Connection and Isolation Panel    In a typical week, how many times do you talk on the phone with family, friends, or neighbors?: More than three times a week    How often do you get together with friends or relatives?: Three times a week    How often do you attend church or religious services?: 1 to 4 times per year    Do you belong to any clubs or organizations such as church groups, unions, fraternal or athletic groups, or school groups?: Yes    How often do you attend meetings of the clubs or organizations you belong to?: Never    Are you married, widowed, divorced, separated, never married, or living with a partner?:  Widowed  Recent Concern: Social Connections - Moderately Isolated (09/19/2023)   Received from [facility]   Social Connection and Isolation Panel    Frequency of Communication with Friends and Family: More than three times a week    Frequency of Social Gatherings with Friends and Family: Three times a week    Attends Religious Services: 1 to 4 times per year    Active Member of Clubs or Organizations: No    Attends Banker Meetings: Never    Marital Status: Widowed    Review of systems General: negative for malaise, night sweats, fever, chills, weight loss Neck: Negative for lumps, goiter, pain and significant neck swelling Resp: Negative for cough, wheezing, dyspnea at rest CV: Negative for chest pain, leg swelling, palpitations, orthopnea GI: denies melena, hematochezia, nausea, vomiting, diarrhea, constipation, dysphagia, odyonophagia, early satiety or unintentional weight loss. +globus sensation with carbonated drinks +constipation MSK: Negative for joint pain or swelling, back pain, and muscle pain. Derm: Negative for itching or rash Psych: Denies depression, anxiety, memory loss, confusion. No homicidal or suicidal ideation.  Heme: Negative for prolonged bleeding, bruising easily, and swollen nodes. Endocrine: Negative for cold or  heat intolerance, polyuria, polydipsia and goiter. Neuro: negative for tremor, gait imbalance, syncope and seizures. The remainder of the review of systems is noncontributory.  Physical Exam: BP 112/69   Pulse (!) 57   Temp 97.6 F (36.4 C)   Ht 5' 1.5 (1.562 m)   Wt 138 lb (62.6 kg)   BMI 25.65 kg/m  General:   Alert and oriented. No distress noted. Pleasant and cooperative.  Head:  Normocephalic and atraumatic. Eyes:  Conjuctiva clear without scleral icterus. Mouth:  Oral mucosa pink and moist. Good dentition. No lesions. Heart: Normal rate and rhythm, s1 and s2 heart sounds present.  Lungs: Clear lung sounds in all lobes. Respirations equal and unlabored. Abdomen:  +BS, soft, non-tender and non-distended. No rebound or guarding. No HSM or masses noted. Derm: No palmar erythema or jaundice Msk:  Symmetrical without gross deformities. Normal posture. Extremities:  Without edema. Neurologic:  Alert and  oriented x4 Psych:  Alert and cooperative. Normal mood and affect.  Invalid input(s): 6 MONTHS   ASSESSMENT: EVER HALBERG is a 73 y.o. female presenting today for follow up of constipation and GERD/globus sensation  Constipation: KUB in October 2024 with large stool burden. Constipation has improved with use of lactulose . She did not find that miralax or linzess helped much in the past, has preferred lactulose  as it seems to keep her bowels moving for the most part. Sometimes she still feels she is not emptying out well or that she has small volume stools. Reports good water  intake, diet high in fruit. No rectal bleeding, abdominal pain or weight loss. We can try increasing lactulose  to 30mg  BID, though if she has diarrhea or urgency may need to titrate back down on dosage.   GERD/globus sensation: rare gerd symptoms and improvement in globus sensation with use of protonix  40mg  daily, notes globus sensation/phlegm in throat only occurs when she drinks a carbonated  drink. No dysphagia, odynophgia, early satiety. Will continue with PPI daily, instructed on good reflux precautions and should avoid carbonated drinks if these cause her an issue   PLAN:  -Increase water  intake, aim for atleast 64 oz per day -Increase fruits, veggies and whole grains, kiwi and prunes are especially good for constipation -continue lactulose , will increase to 30mg  BID,titrate down if having diarrhea or  urgency -continue protonix  40mg  daily -be mindful of greasy, spicy, fried, citrus foods, caffeine, carbonated drinks, chocolate and alcohol  as these can increase reflux symptoms -Stay upright 2-3 hours after eating, prior to lying down and avoid eating late in the evenings.  All questions were answered, patient verbalized understanding and is in agreement with plan as outlined above.    Follow Up: 6 months   Chandlar Guice L. Mariette, MSN, APRN, AGNP-C Adult-Gerontology Nurse Practitioner [facility]  I have reviewed the note and agree with the APP's assessment as described in this progress note  Toribio Fortune, MD Gastroenterology and Hepatology [facility]

## 2024-01-06 NOTE — Patient Instructions (Signed)
-   aim for atleast 64 oz of water  per day -Increase fruits, veggies and whole grains, kiwi and prunes are especially good for constipation -continue lactulose , will increase to 30mg  twice daily, if this is causing diarrhea or urgency, we can decrease it -continue protonix  40mg  daily -be mindful of greasy, spicy, fried, citrus foods, caffeine, carbonated drinks, chocolate and alcohol  as these can increase reflux symptoms Stay upright 2-3 hours after eating, prior to lying down and avoid eating late in the evenings.  Follow up 6 months  It was a pleasure to see you today. I want to create trusting relationships with patients and provide genuine, compassionate, and quality care. I truly value your feedback! please be on the lookout for a survey regarding your visit with me today. I appreciate your input about our visit and your time in completing this!    Jack Bolio L. Rheda Kassab, MSN, APRN, AGNP-C Adult-Gerontology Nurse Practitioner [facility] at [facility]

## 2024-02-01 ENCOUNTER — Telehealth: Payer: Self-pay

## 2024-02-01 NOTE — Progress Notes (Signed)
   02/01/2024  Patient ID: Taylor Shields, female   DOB: [birth year], 73 y.o.   MRN: [id number]  Reviewed patient regarding medication adherence from a quality report for [facility]. The patient's adherence meds are being filled regularly. I called the pharmacy to determine if the patient was enrolled in compliance packaging or auto fills, they confirmed she is getting auto refills but is not getting compliance packaging and therefore qualifies for an extended day supply. I will make the request to her PCP.    Per [facility] and payor portal fill history: Metformin 500 mg - last filled 01/25/24 for a 30-day supply.  Olmesartan 40 mg - last filled 01/25/24 for a 30-day supply.  Pravastatin 40 mg - last filled 01/25/24 for a 30-day supply.    I will follow up for adherence monitoring and 90-day supply prescription renewals.   Thank you for allowing pharmacy to be a part of this patient's care.    Heather Factor, PharmD Clinical Pharmacist  [phone]

## 2024-02-28 DIAGNOSIS — R634 Abnormal weight loss: Secondary | ICD-10-CM | POA: Diagnosis not present

## 2024-02-28 DIAGNOSIS — G894 Chronic pain syndrome: Secondary | ICD-10-CM | POA: Diagnosis not present

## 2024-02-28 DIAGNOSIS — M6281 Muscle weakness (generalized): Secondary | ICD-10-CM | POA: Diagnosis not present

## 2024-02-28 DIAGNOSIS — J449 Chronic obstructive pulmonary disease, unspecified: Secondary | ICD-10-CM | POA: Diagnosis not present

## 2024-02-28 DIAGNOSIS — I10 Essential (primary) hypertension: Secondary | ICD-10-CM | POA: Diagnosis not present

## 2024-02-28 DIAGNOSIS — K5900 Constipation, unspecified: Secondary | ICD-10-CM | POA: Diagnosis not present

## 2024-02-28 DIAGNOSIS — Z515 Encounter for palliative care: Secondary | ICD-10-CM | POA: Diagnosis not present

## 2024-03-03 NOTE — Progress Notes (Signed)
   03/03/2024  Patient ID: Taylor Shields, female   DOB: 1951-06-15, 73 y.o.   MRN: [id number]  Reviewed patient regarding medication adherence from a quality report for [facility]. The patient's adherence meds are being filled regularly. I called the patient to discuss extended (90) day supplies, but there was no answer. I couldn't leave a voicemail, as her mailbox was full.   Heather Factor, PharmD Clinical Pharmacist  [phone]

## 2024-03-22 DIAGNOSIS — I10 Essential (primary) hypertension: Secondary | ICD-10-CM | POA: Diagnosis not present

## 2024-03-22 DIAGNOSIS — Z299 Encounter for prophylactic measures, unspecified: Secondary | ICD-10-CM | POA: Diagnosis not present

## 2024-03-22 DIAGNOSIS — E119 Type 2 diabetes mellitus without complications: Secondary | ICD-10-CM | POA: Diagnosis not present

## 2024-03-22 DIAGNOSIS — J449 Chronic obstructive pulmonary disease, unspecified: Secondary | ICD-10-CM | POA: Diagnosis not present

## 2024-03-22 DIAGNOSIS — J9611 Chronic respiratory failure with hypoxia: Secondary | ICD-10-CM | POA: Diagnosis not present

## 2024-03-22 DIAGNOSIS — F0390 Unspecified dementia without behavioral disturbance: Secondary | ICD-10-CM | POA: Diagnosis not present

## 2024-04-19 DIAGNOSIS — R630 Anorexia: Secondary | ICD-10-CM | POA: Diagnosis not present

## 2024-04-19 DIAGNOSIS — R5383 Other fatigue: Secondary | ICD-10-CM | POA: Diagnosis not present

## 2024-04-19 DIAGNOSIS — M25511 Pain in right shoulder: Secondary | ICD-10-CM | POA: Diagnosis not present

## 2024-04-19 DIAGNOSIS — I10 Essential (primary) hypertension: Secondary | ICD-10-CM | POA: Diagnosis not present

## 2024-04-19 DIAGNOSIS — J449 Chronic obstructive pulmonary disease, unspecified: Secondary | ICD-10-CM | POA: Diagnosis not present

## 2024-04-19 DIAGNOSIS — M6281 Muscle weakness (generalized): Secondary | ICD-10-CM | POA: Diagnosis not present

## 2024-04-19 DIAGNOSIS — R634 Abnormal weight loss: Secondary | ICD-10-CM | POA: Diagnosis not present

## 2024-04-19 DIAGNOSIS — Z515 Encounter for palliative care: Secondary | ICD-10-CM | POA: Diagnosis not present

## 2024-05-09 DIAGNOSIS — Z299 Encounter for prophylactic measures, unspecified: Secondary | ICD-10-CM | POA: Diagnosis not present

## 2024-05-09 DIAGNOSIS — F0390 Unspecified dementia without behavioral disturbance: Secondary | ICD-10-CM | POA: Diagnosis not present

## 2024-05-09 DIAGNOSIS — R0602 Shortness of breath: Secondary | ICD-10-CM | POA: Diagnosis not present

## 2024-05-09 DIAGNOSIS — J449 Chronic obstructive pulmonary disease, unspecified: Secondary | ICD-10-CM | POA: Diagnosis not present

## 2024-05-10 DIAGNOSIS — Z7984 Long term (current) use of oral hypoglycemic drugs: Secondary | ICD-10-CM | POA: Diagnosis not present

## 2024-05-10 DIAGNOSIS — M549 Dorsalgia, unspecified: Secondary | ICD-10-CM | POA: Diagnosis not present

## 2024-05-10 DIAGNOSIS — E1122 Type 2 diabetes mellitus with diabetic chronic kidney disease: Secondary | ICD-10-CM | POA: Diagnosis not present

## 2024-05-10 DIAGNOSIS — I493 Ventricular premature depolarization: Secondary | ICD-10-CM | POA: Diagnosis not present

## 2024-05-10 DIAGNOSIS — M545 Low back pain, unspecified: Secondary | ICD-10-CM | POA: Diagnosis not present

## 2024-05-10 DIAGNOSIS — I440 Atrioventricular block, first degree: Secondary | ICD-10-CM | POA: Diagnosis not present

## 2024-05-10 DIAGNOSIS — I77819 Aortic ectasia, unspecified site: Secondary | ICD-10-CM | POA: Diagnosis not present

## 2024-05-10 DIAGNOSIS — E785 Hyperlipidemia, unspecified: Secondary | ICD-10-CM | POA: Diagnosis not present

## 2024-05-10 DIAGNOSIS — Z20822 Contact with and (suspected) exposure to covid-19: Secondary | ICD-10-CM | POA: Diagnosis not present

## 2024-05-10 DIAGNOSIS — J449 Chronic obstructive pulmonary disease, unspecified: Secondary | ICD-10-CM | POA: Diagnosis not present

## 2024-05-10 DIAGNOSIS — N182 Chronic kidney disease, stage 2 (mild): Secondary | ICD-10-CM | POA: Diagnosis not present

## 2024-05-10 DIAGNOSIS — R0602 Shortness of breath: Secondary | ICD-10-CM | POA: Diagnosis not present

## 2024-05-10 DIAGNOSIS — J9811 Atelectasis: Secondary | ICD-10-CM | POA: Diagnosis not present

## 2024-05-10 DIAGNOSIS — R682 Dry mouth, unspecified: Secondary | ICD-10-CM | POA: Diagnosis not present

## 2024-05-23 DIAGNOSIS — J449 Chronic obstructive pulmonary disease, unspecified: Secondary | ICD-10-CM | POA: Diagnosis not present

## 2024-05-23 DIAGNOSIS — I10 Essential (primary) hypertension: Secondary | ICD-10-CM | POA: Diagnosis not present

## 2024-05-23 DIAGNOSIS — R52 Pain, unspecified: Secondary | ICD-10-CM | POA: Diagnosis not present

## 2024-05-23 DIAGNOSIS — M549 Dorsalgia, unspecified: Secondary | ICD-10-CM | POA: Diagnosis not present

## 2024-05-23 DIAGNOSIS — Z299 Encounter for prophylactic measures, unspecified: Secondary | ICD-10-CM | POA: Diagnosis not present

## 2024-05-23 DIAGNOSIS — J9611 Chronic respiratory failure with hypoxia: Secondary | ICD-10-CM | POA: Diagnosis not present

## 2024-05-24 DIAGNOSIS — J449 Chronic obstructive pulmonary disease, unspecified: Secondary | ICD-10-CM | POA: Diagnosis not present

## 2024-05-24 DIAGNOSIS — Z515 Encounter for palliative care: Secondary | ICD-10-CM | POA: Diagnosis not present

## 2024-05-24 DIAGNOSIS — M25562 Pain in left knee: Secondary | ICD-10-CM | POA: Diagnosis not present

## 2024-05-24 DIAGNOSIS — R630 Anorexia: Secondary | ICD-10-CM | POA: Diagnosis not present

## 2024-05-24 DIAGNOSIS — M25512 Pain in left shoulder: Secondary | ICD-10-CM | POA: Diagnosis not present

## 2024-05-24 DIAGNOSIS — M6281 Muscle weakness (generalized): Secondary | ICD-10-CM | POA: Diagnosis not present

## 2024-05-24 DIAGNOSIS — R5383 Other fatigue: Secondary | ICD-10-CM | POA: Diagnosis not present

## 2024-05-24 DIAGNOSIS — R634 Abnormal weight loss: Secondary | ICD-10-CM | POA: Diagnosis not present

## 2024-06-06 DIAGNOSIS — M109 Gout, unspecified: Secondary | ICD-10-CM | POA: Diagnosis not present

## 2024-06-06 DIAGNOSIS — E785 Hyperlipidemia, unspecified: Secondary | ICD-10-CM | POA: Diagnosis not present

## 2024-06-06 DIAGNOSIS — I872 Venous insufficiency (chronic) (peripheral): Secondary | ICD-10-CM | POA: Diagnosis not present

## 2024-06-06 DIAGNOSIS — Z79890 Hormone replacement therapy (postmenopausal): Secondary | ICD-10-CM | POA: Diagnosis not present

## 2024-06-06 DIAGNOSIS — E1142 Type 2 diabetes mellitus with diabetic polyneuropathy: Secondary | ICD-10-CM | POA: Diagnosis not present

## 2024-06-06 DIAGNOSIS — Z79899 Other long term (current) drug therapy: Secondary | ICD-10-CM | POA: Diagnosis not present

## 2024-06-06 DIAGNOSIS — Z880 Allergy status to penicillin: Secondary | ICD-10-CM | POA: Diagnosis not present

## 2024-06-06 DIAGNOSIS — Z7983 Long term (current) use of bisphosphonates: Secondary | ICD-10-CM | POA: Diagnosis not present

## 2024-06-06 DIAGNOSIS — I4891 Unspecified atrial fibrillation: Secondary | ICD-10-CM | POA: Diagnosis not present

## 2024-06-06 DIAGNOSIS — J4489 Other specified chronic obstructive pulmonary disease: Secondary | ICD-10-CM | POA: Diagnosis not present

## 2024-06-06 DIAGNOSIS — Z59869 Financial insecurity, unspecified: Secondary | ICD-10-CM | POA: Diagnosis not present

## 2024-06-06 DIAGNOSIS — E1122 Type 2 diabetes mellitus with diabetic chronic kidney disease: Secondary | ICD-10-CM | POA: Diagnosis not present

## 2024-06-06 DIAGNOSIS — I130 Hypertensive heart and chronic kidney disease with heart failure and stage 1 through stage 4 chronic kidney disease, or unspecified chronic kidney disease: Secondary | ICD-10-CM | POA: Diagnosis not present

## 2024-06-06 DIAGNOSIS — E1151 Type 2 diabetes mellitus with diabetic peripheral angiopathy without gangrene: Secondary | ICD-10-CM | POA: Diagnosis not present

## 2024-06-06 DIAGNOSIS — E039 Hypothyroidism, unspecified: Secondary | ICD-10-CM | POA: Diagnosis not present

## 2024-06-06 DIAGNOSIS — I509 Heart failure, unspecified: Secondary | ICD-10-CM | POA: Diagnosis not present

## 2024-06-06 DIAGNOSIS — Z7984 Long term (current) use of oral hypoglycemic drugs: Secondary | ICD-10-CM | POA: Diagnosis not present

## 2024-06-06 DIAGNOSIS — G4700 Insomnia, unspecified: Secondary | ICD-10-CM | POA: Diagnosis not present

## 2024-06-06 DIAGNOSIS — Z8701 Personal history of pneumonia (recurrent): Secondary | ICD-10-CM | POA: Diagnosis not present

## 2024-06-06 DIAGNOSIS — Z9981 Dependence on supplemental oxygen: Secondary | ICD-10-CM | POA: Diagnosis not present

## 2024-06-06 DIAGNOSIS — N1832 Chronic kidney disease, stage 3b: Secondary | ICD-10-CM | POA: Diagnosis not present

## 2024-06-06 DIAGNOSIS — M810 Age-related osteoporosis without current pathological fracture: Secondary | ICD-10-CM | POA: Diagnosis not present

## 2024-06-06 DIAGNOSIS — J309 Allergic rhinitis, unspecified: Secondary | ICD-10-CM | POA: Diagnosis not present

## 2024-06-06 DIAGNOSIS — J9610 Chronic respiratory failure, unspecified whether with hypoxia or hypercapnia: Secondary | ICD-10-CM | POA: Diagnosis not present

## 2024-06-06 DIAGNOSIS — Z5948 Other specified lack of adequate food: Secondary | ICD-10-CM | POA: Diagnosis not present

## 2024-06-06 DIAGNOSIS — I2510 Atherosclerotic heart disease of native coronary artery without angina pectoris: Secondary | ICD-10-CM | POA: Diagnosis not present

## 2024-06-06 DIAGNOSIS — I87309 Chronic venous hypertension (idiopathic) without complications of unspecified lower extremity: Secondary | ICD-10-CM | POA: Diagnosis not present

## 2024-06-16 DIAGNOSIS — I10 Essential (primary) hypertension: Secondary | ICD-10-CM | POA: Diagnosis not present

## 2024-06-16 DIAGNOSIS — R0902 Hypoxemia: Secondary | ICD-10-CM | POA: Diagnosis not present

## 2024-06-16 DIAGNOSIS — R0600 Dyspnea, unspecified: Secondary | ICD-10-CM | POA: Diagnosis not present

## 2024-06-16 DIAGNOSIS — R000 Tachycardia, unspecified: Secondary | ICD-10-CM | POA: Diagnosis not present

## 2024-06-17 DIAGNOSIS — J9621 Acute and chronic respiratory failure with hypoxia: Secondary | ICD-10-CM | POA: Diagnosis not present

## 2024-06-17 DIAGNOSIS — N1831 Chronic kidney disease, stage 3a: Secondary | ICD-10-CM | POA: Diagnosis not present

## 2024-06-17 DIAGNOSIS — I1A0 Resistant hypertension: Secondary | ICD-10-CM | POA: Diagnosis not present

## 2024-06-17 DIAGNOSIS — I129 Hypertensive chronic kidney disease with stage 1 through stage 4 chronic kidney disease, or unspecified chronic kidney disease: Secondary | ICD-10-CM | POA: Diagnosis not present

## 2024-06-17 DIAGNOSIS — R7989 Other specified abnormal findings of blood chemistry: Secondary | ICD-10-CM | POA: Diagnosis not present

## 2024-06-17 DIAGNOSIS — E1122 Type 2 diabetes mellitus with diabetic chronic kidney disease: Secondary | ICD-10-CM | POA: Diagnosis not present

## 2024-06-18 DIAGNOSIS — R7989 Other specified abnormal findings of blood chemistry: Secondary | ICD-10-CM | POA: Diagnosis not present

## 2024-06-18 DIAGNOSIS — E1122 Type 2 diabetes mellitus with diabetic chronic kidney disease: Secondary | ICD-10-CM | POA: Diagnosis not present

## 2024-06-18 DIAGNOSIS — J449 Chronic obstructive pulmonary disease, unspecified: Secondary | ICD-10-CM | POA: Diagnosis not present

## 2024-06-18 DIAGNOSIS — E039 Hypothyroidism, unspecified: Secondary | ICD-10-CM | POA: Diagnosis not present

## 2024-06-18 DIAGNOSIS — K219 Gastro-esophageal reflux disease without esophagitis: Secondary | ICD-10-CM | POA: Diagnosis not present

## 2024-06-18 DIAGNOSIS — I129 Hypertensive chronic kidney disease with stage 1 through stage 4 chronic kidney disease, or unspecified chronic kidney disease: Secondary | ICD-10-CM | POA: Diagnosis not present

## 2024-06-18 DIAGNOSIS — E785 Hyperlipidemia, unspecified: Secondary | ICD-10-CM | POA: Diagnosis not present

## 2024-06-18 DIAGNOSIS — N1831 Chronic kidney disease, stage 3a: Secondary | ICD-10-CM | POA: Diagnosis not present

## 2024-06-19 ENCOUNTER — Encounter (INDEPENDENT_AMBULATORY_CARE_PROVIDER_SITE_OTHER): Admitting: Gastroenterology

## 2024-06-19 DIAGNOSIS — I130 Hypertensive heart and chronic kidney disease with heart failure and stage 1 through stage 4 chronic kidney disease, or unspecified chronic kidney disease: Secondary | ICD-10-CM | POA: Diagnosis not present

## 2024-06-19 DIAGNOSIS — J9621 Acute and chronic respiratory failure with hypoxia: Secondary | ICD-10-CM | POA: Diagnosis not present

## 2024-06-19 DIAGNOSIS — I5033 Acute on chronic diastolic (congestive) heart failure: Secondary | ICD-10-CM | POA: Diagnosis not present

## 2024-06-19 DIAGNOSIS — R7989 Other specified abnormal findings of blood chemistry: Secondary | ICD-10-CM | POA: Diagnosis not present

## 2024-06-19 DIAGNOSIS — E785 Hyperlipidemia, unspecified: Secondary | ICD-10-CM | POA: Diagnosis not present

## 2024-06-19 DIAGNOSIS — E039 Hypothyroidism, unspecified: Secondary | ICD-10-CM | POA: Diagnosis not present

## 2024-06-19 DIAGNOSIS — K219 Gastro-esophageal reflux disease without esophagitis: Secondary | ICD-10-CM | POA: Diagnosis not present

## 2024-06-19 DIAGNOSIS — N1831 Chronic kidney disease, stage 3a: Secondary | ICD-10-CM | POA: Diagnosis not present

## 2024-06-19 DIAGNOSIS — J449 Chronic obstructive pulmonary disease, unspecified: Secondary | ICD-10-CM | POA: Diagnosis not present

## 2024-06-20 DIAGNOSIS — E1122 Type 2 diabetes mellitus with diabetic chronic kidney disease: Secondary | ICD-10-CM | POA: Diagnosis not present

## 2024-06-20 DIAGNOSIS — I130 Hypertensive heart and chronic kidney disease with heart failure and stage 1 through stage 4 chronic kidney disease, or unspecified chronic kidney disease: Secondary | ICD-10-CM | POA: Diagnosis not present

## 2024-06-20 DIAGNOSIS — J449 Chronic obstructive pulmonary disease, unspecified: Secondary | ICD-10-CM | POA: Diagnosis not present

## 2024-06-20 DIAGNOSIS — N1831 Chronic kidney disease, stage 3a: Secondary | ICD-10-CM | POA: Diagnosis not present

## 2024-06-20 DIAGNOSIS — I5033 Acute on chronic diastolic (congestive) heart failure: Secondary | ICD-10-CM | POA: Diagnosis not present

## 2024-06-20 DIAGNOSIS — E785 Hyperlipidemia, unspecified: Secondary | ICD-10-CM | POA: Diagnosis not present

## 2024-06-20 DIAGNOSIS — K219 Gastro-esophageal reflux disease without esophagitis: Secondary | ICD-10-CM | POA: Diagnosis not present

## 2024-06-20 DIAGNOSIS — E039 Hypothyroidism, unspecified: Secondary | ICD-10-CM | POA: Diagnosis not present

## 2024-06-21 DIAGNOSIS — K219 Gastro-esophageal reflux disease without esophagitis: Secondary | ICD-10-CM | POA: Diagnosis not present

## 2024-06-21 DIAGNOSIS — N1831 Chronic kidney disease, stage 3a: Secondary | ICD-10-CM | POA: Diagnosis not present

## 2024-06-21 DIAGNOSIS — J449 Chronic obstructive pulmonary disease, unspecified: Secondary | ICD-10-CM | POA: Diagnosis not present

## 2024-06-21 DIAGNOSIS — E039 Hypothyroidism, unspecified: Secondary | ICD-10-CM | POA: Diagnosis not present

## 2024-06-21 DIAGNOSIS — I130 Hypertensive heart and chronic kidney disease with heart failure and stage 1 through stage 4 chronic kidney disease, or unspecified chronic kidney disease: Secondary | ICD-10-CM | POA: Diagnosis not present

## 2024-06-21 DIAGNOSIS — E785 Hyperlipidemia, unspecified: Secondary | ICD-10-CM | POA: Diagnosis not present

## 2024-06-21 DIAGNOSIS — I5033 Acute on chronic diastolic (congestive) heart failure: Secondary | ICD-10-CM | POA: Diagnosis not present

## 2024-06-21 DIAGNOSIS — E1122 Type 2 diabetes mellitus with diabetic chronic kidney disease: Secondary | ICD-10-CM | POA: Diagnosis not present
